# Patient Record
Sex: Female | Born: 1975
Health system: Southern US, Community
[De-identification: ages and names within clinical notes are randomized; demographics above are authoritative.]

## PROBLEM LIST (undated history)

## (undated) DIAGNOSIS — E079 Disorder of thyroid, unspecified: Secondary | ICD-10-CM

---

## 1999-10-12 ENCOUNTER — Other Ambulatory Visit: Admission: RE | Admit: 1999-10-12 | Discharge: 1999-10-12 | Payer: Self-pay | Admitting: *Deleted

## 2001-04-29 ENCOUNTER — Encounter: Payer: Self-pay | Admitting: Emergency Medicine

## 2001-04-29 ENCOUNTER — Emergency Department (HOSPITAL_COMMUNITY): Admission: EM | Admit: 2001-04-29 | Discharge: 2001-04-29 | Payer: Self-pay | Admitting: Emergency Medicine

## 2003-08-31 ENCOUNTER — Other Ambulatory Visit: Admission: RE | Admit: 2003-08-31 | Discharge: 2003-08-31 | Payer: Self-pay | Admitting: Obstetrics and Gynecology

## 2007-09-08 ENCOUNTER — Inpatient Hospital Stay (HOSPITAL_COMMUNITY): Admission: AD | Admit: 2007-09-08 | Discharge: 2007-09-08 | Payer: Self-pay | Admitting: Obstetrics and Gynecology

## 2008-01-24 ENCOUNTER — Ambulatory Visit (HOSPITAL_COMMUNITY): Admission: RE | Admit: 2008-01-24 | Discharge: 2008-01-24 | Payer: Self-pay | Admitting: *Deleted

## 2008-01-30 ENCOUNTER — Ambulatory Visit (HOSPITAL_COMMUNITY): Admission: RE | Admit: 2008-01-30 | Discharge: 2008-01-30 | Payer: Self-pay | Admitting: *Deleted

## 2008-02-10 ENCOUNTER — Encounter: Admission: RE | Admit: 2008-02-10 | Discharge: 2008-02-10 | Payer: Self-pay | Admitting: *Deleted

## 2009-05-17 ENCOUNTER — Inpatient Hospital Stay (HOSPITAL_COMMUNITY): Admission: AD | Admit: 2009-05-17 | Discharge: 2009-05-20 | Payer: Self-pay | Admitting: Obstetrics and Gynecology

## 2010-11-25 LAB — COMPREHENSIVE METABOLIC PANEL
ALT: 18 U/L (ref 0–35)
AST: 18 U/L (ref 0–37)
Albumin: 2.4 g/dL — ABNORMAL LOW (ref 3.5–5.2)
Alkaline Phosphatase: 101 U/L (ref 39–117)
BUN: 6 mg/dL (ref 6–23)
CO2: 23 mEq/L (ref 19–32)
Calcium: 9.2 mg/dL (ref 8.4–10.5)
Chloride: 106 mEq/L (ref 96–112)
Creatinine, Ser: 0.56 mg/dL (ref 0.4–1.2)
GFR calc Af Amer: >60 mL/min (ref >60)
GFR calc non Af Amer: >60 mL/min (ref >60)
Glucose, Bld: 106 mg/dL — ABNORMAL HIGH (ref 70–99)
Potassium: 4 mEq/L (ref 3.5–5.1)
Sodium: 136 mEq/L (ref 135–145)
Total Bilirubin: 0.3 mg/dL (ref 0.3–1.2)
Total Protein: 6 g/dL (ref 6.0–8.3)

## 2010-11-25 LAB — CBC
HCT: 29.1 % — ABNORMAL LOW (ref 36.0–46.0)
HCT: 36.3 % (ref 36.0–46.0)
Hemoglobin: 12.2 g/dL (ref 12.0–15.0)
Hemoglobin: 9.9 g/dL — ABNORMAL LOW (ref 12.0–15.0)
MCHC: 33.6 g/dL (ref 30.0–36.0)
MCHC: 34 g/dL (ref 30.0–36.0)
MCV: 89.5 fL (ref 78.0–100.0)
Platelets: 263 10*3/uL (ref 150–400)
RBC: 4.05 MIL/uL (ref 3.87–5.11)
RDW: 13.4 % (ref 11.5–15.5)
RDW: 13.6 % (ref 11.5–15.5)
WBC: 13 10*3/uL — ABNORMAL HIGH (ref 4.0–10.5)

## 2010-11-25 LAB — LACTATE DEHYDROGENASE: LDH: 121 U/L (ref 94–250)

## 2010-11-25 LAB — TYPE AND SCREEN: Antibody Screen: NEGATIVE

## 2010-11-25 LAB — URIC ACID: Uric Acid, Serum: 4.6 mg/dL (ref 2.4–7.0)

## 2010-11-25 LAB — RPR: RPR Ser Ql: NONREACTIVE

## 2011-05-12 LAB — POCT PREGNANCY, URINE: Preg Test, Ur: POSITIVE

## 2016-01-13 ENCOUNTER — Telehealth: Payer: Self-pay | Admitting: Cardiovascular Disease

## 2016-01-13 NOTE — Telephone Encounter (Signed)
Received records from [facility] for appointment on 02/08/16 with Dr Allyson SabalBerry.  [facility] for Dr Hazle CocaBerry's schedule on 02/08/16. lp

## 2016-02-08 ENCOUNTER — Ambulatory Visit: Payer: 59 | Admitting: Cardiovascular Disease

## 2016-04-04 ENCOUNTER — Ambulatory Visit: Payer: 59 | Admitting: Cardiovascular Disease

## 2016-04-04 ENCOUNTER — Encounter: Payer: Self-pay | Admitting: *Deleted

## 2016-04-04 NOTE — Progress Notes (Signed)
Letter mailed

## 2016-06-12 ENCOUNTER — Other Ambulatory Visit (HOSPITAL_COMMUNITY): Payer: Self-pay | Admitting: Surgery

## 2016-06-19 ENCOUNTER — Encounter: Payer: Self-pay | Admitting: Skilled Nursing Facility1

## 2016-06-19 ENCOUNTER — Encounter: Payer: 59 | Attending: Surgery | Admitting: Skilled Nursing Facility1

## 2016-06-19 DIAGNOSIS — E6609 Other obesity due to excess calories: Secondary | ICD-10-CM

## 2016-06-19 NOTE — Patient Instructions (Signed)
Follow Pre-Op Goals Try Protein Shakes Call [facility] at [phone] when surgery is scheduled to enroll in Pre-Op Class  Things to remember:  Please always be honest with us. We want to support you!  If you have any questions or concerns in between appointments, please call or email Liz, Leslie, or Laurie.  The diet after surgery will be high protein and low in carbohydrate.  Vitamins and calcium need to be taken for the rest of your life.  Feel free to include support people in any classes or appointments.   Supplement recommendations:  Before Surgery   1 Complete Multivitamin with Iron  3000 IU Vitamin D3  After Surgery   2 Chewable Multivitamins  **Best Choice - Bariatric Advantage Advanced Multi EA      3 Chewable Calcium (500 mg each, total 1200-1500 mg per day)  **Best Choice - Celebrate, Bariatric Advantage, or Wellesse  Other Options:    2 Flinstones Complete + up to 100 mg Thiamin + 2000-3000 IU Vitamin D3 + 350-500 mcg Vitamin B12 + 30-45 mg Iron (with history of deficiency)  2 Celebrate MultiComplete with 18 mg Iron (this provides 6000 IU of  Vitamin D3)  4 Celebrate Essential Multi 2 in 1 (has calcium) + 18-60 mg separate  iron  Vitamins and Calcium are available at:   [facility]   [address], [place], [place] [address]   [email or web address]  [email or web address]  [email or web address]   

## 2016-06-19 NOTE — Progress Notes (Signed)
   Pre-Op Assessment Visit:  Pre-Operative Roux En Y Bypass Surgery  Medical Nutrition Therapy:  Appt start time: 2:10  End time: 3:03  Patient was seen on 06/19/2016 for Pre-Operative Nutrition Assessment. Assessment and [facility] coordinator on 06/19/2016.  Pt states she wants her husband to do the surgery too. Pt states she is worried about having the right surgery to assuage her emotional eating tendencies.  Preferred Learning Style:   No preference indicated   Learning Readiness:   Ready  Handouts given during visit include:  Pre-Op Goals Bariatric Surgery Protein Shakes During the appointment today the following Pre-Op Goals were reviewed with the patient: Maintain or lose weight as instructed by your surgeon Make healthy food choices Begin to limit portion sizes Limited concentrated sugars and fried foods Keep fat/sugar in the single digits per serving on  food labels Practice CHEWING your food  (aim for 30 chews per bite or until applesauce consistency) Practice not drinking 15 minutes before, during, and 30 minutes after each meal/snack Avoid all carbonated beverages  Avoid/limit caffeinated beverages  Avoid all sugar-sweetened beverages Consume 3 meals per day; eat every 3-5 hours Make a list of non-food related activities Aim for 64-100 ounces of FLUID daily  Aim for at least 60-80 grams of PROTEIN daily Look for a liquid protein source that contain ?15 g protein and ?5 g carbohydrate  (ex: shakes, drinks, shots)  Patient-Centered Goals: 10/10 specific/non-scale and confidence/importance scale 1-10  Demonstrated degree of understanding via:  Teach Back  Teaching Method Utilized:  Visual Auditory Hands on  Barriers to learning/adherence to lifestyle change: none identified  [facility] to [facility] the [facility] to enroll in Pre-Op and Post-Op Nutrition Education when  surgery date is scheduled.

## 2016-06-22 ENCOUNTER — Ambulatory Visit (HOSPITAL_COMMUNITY): Payer: 59

## 2016-07-07 ENCOUNTER — Ambulatory Visit (HOSPITAL_COMMUNITY)
Admission: RE | Admit: 2016-07-07 | Discharge: 2016-07-07 | Disposition: A | Discharge status: Home / Self Care | Payer: 59 | Source: Ambulatory Visit | Attending: Surgery | Admitting: Surgery

## 2016-07-07 ENCOUNTER — Encounter (HOSPITAL_COMMUNITY): Payer: Self-pay | Admitting: Radiology

## 2016-07-07 DIAGNOSIS — Z6841 Body Mass Index (BMI) 40.0 and over, adult: Secondary | ICD-10-CM | POA: Diagnosis not present

## 2016-07-07 DIAGNOSIS — K219 Gastro-esophageal reflux disease without esophagitis: Secondary | ICD-10-CM | POA: Diagnosis not present

## 2016-07-31 ENCOUNTER — Encounter: Payer: 59 | Attending: Surgery | Admitting: Dietician

## 2016-08-01 ENCOUNTER — Encounter: Payer: Self-pay | Admitting: Dietician

## 2016-08-01 NOTE — Progress Notes (Signed)
  Pre-Operative Nutrition Class:  Appt start time: 3832   End time:  1830.  Patient was seen on 07/31/16 for Pre-Operative Bariatric Surgery Education at the [facility].   Surgery date:  Surgery type: RYGB Start weight at [facility]: 306 lbs on 06/19/2016 Weight today: 307.3 lbs  TANITA  BODY COMP RESULTS  07/31/16   BMI (kg/m^2) n/a   Fat Mass (lbs)    Fat Free Mass (lbs)    Total Body Water (lbs)    Samples given per MNT protocol. Patient educated on appropriate usage: Premier protein shake (strawberry - qty 1) Lot #: [id number] Exp: 05/2017  Unjury Protein Powder (chicken soup - qty 1) Lot #: [id number] Exp: 10/2017  The following the learning objectives were met by the patient during this course:  Identify Pre-Op Dietary Goals and will begin 2 weeks pre-operatively  Identify appropriate sources of fluids and proteins   State protein recommendations and appropriate sources pre and post-operatively  Identify Post-Operative Dietary Goals and will follow for 2 weeks post-operatively  Identify appropriate multivitamin and calcium sources  Describe the need for physical activity post-operatively and will follow MD recommendations  State when to call healthcare provider regarding medication questions or post-operative complications  Handouts given during class include:  Pre-Op Bariatric Surgery Diet Handout  Protein Shake Handout  Post-Op Bariatric Surgery Nutrition Handout  BELT Program Information Flyer  Support Group Information Flyer  [facility] Bariatric Supplements Price List  Follow-Up Plan: Patient will follow-up at [facility] 2 weeks post operatively for diet advancement per MD.

## 2016-09-15 DIAGNOSIS — N951 Menopausal and female climacteric states: Secondary | ICD-10-CM | POA: Diagnosis not present

## 2016-09-19 DIAGNOSIS — N951 Menopausal and female climacteric states: Secondary | ICD-10-CM | POA: Diagnosis not present

## 2016-09-19 DIAGNOSIS — I10 Essential (primary) hypertension: Secondary | ICD-10-CM | POA: Diagnosis not present

## 2016-09-19 DIAGNOSIS — E039 Hypothyroidism, unspecified: Secondary | ICD-10-CM | POA: Diagnosis not present

## 2016-09-19 DIAGNOSIS — E539 Vitamin B deficiency, unspecified: Secondary | ICD-10-CM | POA: Diagnosis not present

## 2016-09-19 DIAGNOSIS — R7301 Impaired fasting glucose: Secondary | ICD-10-CM | POA: Diagnosis not present

## 2016-09-28 DIAGNOSIS — R7301 Impaired fasting glucose: Secondary | ICD-10-CM | POA: Diagnosis not present

## 2016-09-28 DIAGNOSIS — E539 Vitamin B deficiency, unspecified: Secondary | ICD-10-CM | POA: Diagnosis not present

## 2016-09-28 DIAGNOSIS — I10 Essential (primary) hypertension: Secondary | ICD-10-CM | POA: Diagnosis not present

## 2016-10-06 DIAGNOSIS — E539 Vitamin B deficiency, unspecified: Secondary | ICD-10-CM | POA: Diagnosis not present

## 2016-10-13 DIAGNOSIS — E538 Deficiency of other specified B group vitamins: Secondary | ICD-10-CM | POA: Diagnosis not present

## 2016-10-19 DIAGNOSIS — Z124 Encounter for screening for malignant neoplasm of cervix: Secondary | ICD-10-CM | POA: Diagnosis not present

## 2016-10-19 DIAGNOSIS — Z01419 Encounter for gynecological examination (general) (routine) without abnormal findings: Secondary | ICD-10-CM | POA: Diagnosis not present

## 2017-05-01 DIAGNOSIS — R946 Abnormal results of thyroid function studies: Secondary | ICD-10-CM | POA: Diagnosis not present

## 2017-05-03 DIAGNOSIS — M2550 Pain in unspecified joint: Secondary | ICD-10-CM | POA: Diagnosis not present

## 2017-05-03 DIAGNOSIS — N926 Irregular menstruation, unspecified: Secondary | ICD-10-CM | POA: Diagnosis not present

## 2017-05-03 DIAGNOSIS — E039 Hypothyroidism, unspecified: Secondary | ICD-10-CM | POA: Diagnosis not present

## 2017-05-04 DIAGNOSIS — M170 Bilateral primary osteoarthritis of knee: Secondary | ICD-10-CM | POA: Diagnosis not present

## 2017-05-04 DIAGNOSIS — M2550 Pain in unspecified joint: Secondary | ICD-10-CM | POA: Diagnosis not present

## 2017-05-08 DIAGNOSIS — R700 Elevated erythrocyte sedimentation rate: Secondary | ICD-10-CM | POA: Diagnosis not present

## 2017-05-08 DIAGNOSIS — M79672 Pain in left foot: Secondary | ICD-10-CM | POA: Diagnosis not present

## 2017-05-08 DIAGNOSIS — M7989 Other specified soft tissue disorders: Secondary | ICD-10-CM | POA: Diagnosis not present

## 2017-05-08 DIAGNOSIS — M79641 Pain in right hand: Secondary | ICD-10-CM | POA: Diagnosis not present

## 2017-05-08 DIAGNOSIS — M170 Bilateral primary osteoarthritis of knee: Secondary | ICD-10-CM | POA: Diagnosis not present

## 2017-05-08 DIAGNOSIS — M2550 Pain in unspecified joint: Secondary | ICD-10-CM | POA: Diagnosis not present

## 2017-05-08 DIAGNOSIS — M25561 Pain in right knee: Secondary | ICD-10-CM | POA: Diagnosis not present

## 2017-05-08 DIAGNOSIS — M79643 Pain in unspecified hand: Secondary | ICD-10-CM | POA: Diagnosis not present

## 2017-05-08 DIAGNOSIS — M19041 Primary osteoarthritis, right hand: Secondary | ICD-10-CM | POA: Diagnosis not present

## 2017-05-30 DIAGNOSIS — M79643 Pain in unspecified hand: Secondary | ICD-10-CM | POA: Diagnosis not present

## 2017-05-30 DIAGNOSIS — M2550 Pain in unspecified joint: Secondary | ICD-10-CM | POA: Diagnosis not present

## 2017-05-30 DIAGNOSIS — N390 Urinary tract infection, site not specified: Secondary | ICD-10-CM | POA: Diagnosis not present

## 2017-05-30 DIAGNOSIS — R700 Elevated erythrocyte sedimentation rate: Secondary | ICD-10-CM | POA: Diagnosis not present

## 2017-07-16 DIAGNOSIS — R0989 Other specified symptoms and signs involving the circulatory and respiratory systems: Secondary | ICD-10-CM | POA: Diagnosis not present

## 2017-07-16 DIAGNOSIS — R0981 Nasal congestion: Secondary | ICD-10-CM | POA: Diagnosis not present

## 2017-08-08 DIAGNOSIS — Z0100 Encounter for examination of eyes and vision without abnormal findings: Secondary | ICD-10-CM | POA: Diagnosis not present

## 2017-08-09 DIAGNOSIS — M79644 Pain in right finger(s): Secondary | ICD-10-CM | POA: Diagnosis not present

## 2017-08-09 DIAGNOSIS — R200 Anesthesia of skin: Secondary | ICD-10-CM | POA: Diagnosis not present

## 2017-09-13 DIAGNOSIS — M79645 Pain in left finger(s): Secondary | ICD-10-CM | POA: Diagnosis not present

## 2017-09-13 DIAGNOSIS — R200 Anesthesia of skin: Secondary | ICD-10-CM | POA: Diagnosis not present

## 2017-09-13 DIAGNOSIS — M79644 Pain in right finger(s): Secondary | ICD-10-CM | POA: Diagnosis not present

## 2017-10-15 DIAGNOSIS — R700 Elevated erythrocyte sedimentation rate: Secondary | ICD-10-CM | POA: Diagnosis not present

## 2017-10-15 DIAGNOSIS — M549 Dorsalgia, unspecified: Secondary | ICD-10-CM | POA: Diagnosis not present

## 2017-10-15 DIAGNOSIS — M79643 Pain in unspecified hand: Secondary | ICD-10-CM | POA: Diagnosis not present

## 2017-10-15 DIAGNOSIS — M545 Low back pain: Secondary | ICD-10-CM | POA: Diagnosis not present

## 2017-10-15 DIAGNOSIS — M2550 Pain in unspecified joint: Secondary | ICD-10-CM | POA: Diagnosis not present

## 2017-11-01 DIAGNOSIS — E669 Obesity, unspecified: Secondary | ICD-10-CM | POA: Diagnosis not present

## 2017-11-01 DIAGNOSIS — E039 Hypothyroidism, unspecified: Secondary | ICD-10-CM | POA: Diagnosis not present

## 2017-11-01 DIAGNOSIS — M2550 Pain in unspecified joint: Secondary | ICD-10-CM | POA: Diagnosis not present

## 2017-11-15 DIAGNOSIS — Z124 Encounter for screening for malignant neoplasm of cervix: Secondary | ICD-10-CM | POA: Diagnosis not present

## 2017-11-15 DIAGNOSIS — Z01419 Encounter for gynecological examination (general) (routine) without abnormal findings: Secondary | ICD-10-CM | POA: Diagnosis not present

## 2017-12-24 DIAGNOSIS — J069 Acute upper respiratory infection, unspecified: Secondary | ICD-10-CM | POA: Diagnosis not present

## 2017-12-24 DIAGNOSIS — B9789 Other viral agents as the cause of diseases classified elsewhere: Secondary | ICD-10-CM | POA: Diagnosis not present

## 2018-01-16 DIAGNOSIS — M67442 Ganglion, left hand: Secondary | ICD-10-CM | POA: Diagnosis not present

## 2018-03-04 DIAGNOSIS — M79644 Pain in right finger(s): Secondary | ICD-10-CM | POA: Diagnosis not present

## 2018-04-16 DIAGNOSIS — R635 Abnormal weight gain: Secondary | ICD-10-CM | POA: Diagnosis not present

## 2018-04-16 DIAGNOSIS — N951 Menopausal and female climacteric states: Secondary | ICD-10-CM | POA: Diagnosis not present

## 2018-04-23 DIAGNOSIS — R7301 Impaired fasting glucose: Secondary | ICD-10-CM | POA: Diagnosis not present

## 2018-04-23 DIAGNOSIS — N951 Menopausal and female climacteric states: Secondary | ICD-10-CM | POA: Diagnosis not present

## 2018-05-06 DIAGNOSIS — I10 Essential (primary) hypertension: Secondary | ICD-10-CM | POA: Diagnosis not present

## 2018-05-27 DIAGNOSIS — I10 Essential (primary) hypertension: Secondary | ICD-10-CM | POA: Diagnosis not present

## 2018-05-27 DIAGNOSIS — E7800 Pure hypercholesterolemia, unspecified: Secondary | ICD-10-CM | POA: Diagnosis not present

## 2018-05-28 DIAGNOSIS — M7731 Calcaneal spur, right foot: Secondary | ICD-10-CM | POA: Diagnosis not present

## 2018-05-28 DIAGNOSIS — M722 Plantar fascial fibromatosis: Secondary | ICD-10-CM | POA: Diagnosis not present

## 2018-05-28 DIAGNOSIS — M76821 Posterior tibial tendinitis, right leg: Secondary | ICD-10-CM | POA: Diagnosis not present

## 2018-06-05 DIAGNOSIS — M71572 Other bursitis, not elsewhere classified, left ankle and foot: Secondary | ICD-10-CM | POA: Diagnosis not present

## 2018-06-05 DIAGNOSIS — M722 Plantar fascial fibromatosis: Secondary | ICD-10-CM | POA: Diagnosis not present

## 2018-06-06 DIAGNOSIS — I10 Essential (primary) hypertension: Secondary | ICD-10-CM | POA: Diagnosis not present

## 2018-06-13 DIAGNOSIS — E7800 Pure hypercholesterolemia, unspecified: Secondary | ICD-10-CM | POA: Diagnosis not present

## 2018-06-20 DIAGNOSIS — R209 Unspecified disturbances of skin sensation: Secondary | ICD-10-CM | POA: Diagnosis not present

## 2018-06-20 DIAGNOSIS — E669 Obesity, unspecified: Secondary | ICD-10-CM | POA: Diagnosis not present

## 2018-06-20 DIAGNOSIS — E039 Hypothyroidism, unspecified: Secondary | ICD-10-CM | POA: Diagnosis not present

## 2018-07-04 DIAGNOSIS — I10 Essential (primary) hypertension: Secondary | ICD-10-CM | POA: Diagnosis not present

## 2018-09-22 DIAGNOSIS — H0011 Chalazion right upper eyelid: Secondary | ICD-10-CM | POA: Diagnosis not present

## 2018-09-22 DIAGNOSIS — I888 Other nonspecific lymphadenitis: Secondary | ICD-10-CM | POA: Diagnosis not present

## 2018-10-29 DIAGNOSIS — S90122A Contusion of left lesser toe(s) without damage to nail, initial encounter: Secondary | ICD-10-CM | POA: Diagnosis not present

## 2018-10-29 DIAGNOSIS — R05 Cough: Secondary | ICD-10-CM | POA: Diagnosis not present

## 2019-08-31 ENCOUNTER — Encounter (HOSPITAL_COMMUNITY): Payer: Self-pay | Admitting: Emergency Medicine

## 2019-08-31 ENCOUNTER — Emergency Department (HOSPITAL_COMMUNITY)
Admission: EM | Admit: 2019-08-31 | Discharge: 2019-08-31 | Disposition: A | Discharge status: Home / Self Care | Payer: 59 | Attending: Emergency Medicine | Admitting: Emergency Medicine

## 2019-08-31 ENCOUNTER — Emergency Department (HOSPITAL_COMMUNITY): Payer: 59

## 2019-08-31 ENCOUNTER — Other Ambulatory Visit: Payer: Self-pay

## 2019-08-31 DIAGNOSIS — Z20822 Contact with and (suspected) exposure to covid-19: Secondary | ICD-10-CM | POA: Insufficient documentation

## 2019-08-31 DIAGNOSIS — R079 Chest pain, unspecified: Secondary | ICD-10-CM

## 2019-08-31 DIAGNOSIS — R509 Fever, unspecified: Secondary | ICD-10-CM | POA: Diagnosis not present

## 2019-08-31 DIAGNOSIS — Z79899 Other long term (current) drug therapy: Secondary | ICD-10-CM | POA: Insufficient documentation

## 2019-08-31 DIAGNOSIS — M79601 Pain in right arm: Secondary | ICD-10-CM | POA: Insufficient documentation

## 2019-08-31 DIAGNOSIS — R0602 Shortness of breath: Secondary | ICD-10-CM | POA: Insufficient documentation

## 2019-08-31 DIAGNOSIS — R000 Tachycardia, unspecified: Secondary | ICD-10-CM | POA: Insufficient documentation

## 2019-08-31 DIAGNOSIS — R5383 Other fatigue: Secondary | ICD-10-CM | POA: Diagnosis not present

## 2019-08-31 DIAGNOSIS — R918 Other nonspecific abnormal finding of lung field: Secondary | ICD-10-CM | POA: Diagnosis not present

## 2019-08-31 DIAGNOSIS — R0789 Other chest pain: Secondary | ICD-10-CM | POA: Insufficient documentation

## 2019-08-31 DIAGNOSIS — R61 Generalized hyperhidrosis: Secondary | ICD-10-CM | POA: Insufficient documentation

## 2019-08-31 DIAGNOSIS — E039 Hypothyroidism, unspecified: Secondary | ICD-10-CM | POA: Insufficient documentation

## 2019-08-31 HISTORY — DX: Disorder of thyroid, unspecified: E07.9

## 2019-08-31 LAB — BASIC METABOLIC PANEL
Anion gap: 11 (ref 5–15)
BUN: 17 mg/dL (ref 6–20)
CO2: 25 mmol/L (ref 22–32)
Calcium: 9.8 mg/dL (ref 8.9–10.3)
Chloride: 100 mmol/L (ref 98–111)
Creatinine, Ser: 1.01 mg/dL — ABNORMAL HIGH (ref 0.44–1.00)
GFR calc Af Amer: >60 mL/min (ref >60)
GFR calc non Af Amer: >60 mL/min (ref >60)
Glucose, Bld: 113 mg/dL — ABNORMAL HIGH (ref 70–99)
Potassium: 3.9 mmol/L (ref 3.5–5.1)
Sodium: 136 mmol/L (ref 135–145)

## 2019-08-31 LAB — CBC
HCT: 48.6 % — ABNORMAL HIGH (ref 36.0–46.0)
Hemoglobin: 15.6 g/dL — ABNORMAL HIGH (ref 12.0–15.0)
MCH: 26.5 pg (ref 26.0–34.0)
MCHC: 32.1 g/dL (ref 30.0–36.0)
MCV: 82.7 fL (ref 80.0–100.0)
Platelets: 364 10*3/uL (ref 150–400)
RBC: 5.88 MIL/uL — ABNORMAL HIGH (ref 3.87–5.11)
RDW: 14.5 % (ref 11.5–15.5)
WBC: 6.4 10*3/uL (ref 4.0–10.5)
nRBC: 0 % (ref 0.0–0.2)

## 2019-08-31 LAB — TROPONIN I (HIGH SENSITIVITY): Troponin I (High Sensitivity): <2 ng/L (ref <18)

## 2019-08-31 LAB — D-DIMER, QUANTITATIVE: D-Dimer, Quant: 0.78 ug/mL-FEU — ABNORMAL HIGH (ref 0.00–0.50)

## 2019-08-31 LAB — I-STAT BETA HCG BLOOD, ED (MC, WL, AP ONLY): I-stat hCG, quantitative: <5 m[IU]/mL (ref <5)

## 2019-08-31 LAB — TSH: TSH: 1.401 u[IU]/mL (ref 0.350–4.500)

## 2019-08-31 LAB — POC SARS CORONAVIRUS 2 AG -  ED: SARS Coronavirus 2 Ag: NEGATIVE

## 2019-08-31 LAB — SARS CORONAVIRUS 2 (TAT 6-24 HRS): SARS Coronavirus 2: NEGATIVE

## 2019-08-31 MED ORDER — SODIUM CHLORIDE 0.9 % IV BOLUS
1000.0000 mL | Freq: Once | INTRAVENOUS | Status: AC
  Administered 2019-08-31: 1000 mL via INTRAVENOUS

## 2019-08-31 MED ORDER — HYDROXYZINE HCL 25 MG PO TABS: 25.0000 mg | ORAL_TABLET | Freq: Four times a day (QID) | ORAL | 0 refills | Status: DC

## 2019-08-31 MED ORDER — SODIUM CHLORIDE 0.9% FLUSH: 3.0000 mL | Freq: Once | INTRAVENOUS | Status: DC

## 2019-08-31 MED ORDER — IOHEXOL 350 MG/ML SOLN
75.0000 mL | Freq: Once | INTRAVENOUS | Status: AC | PRN
  Administered 2019-08-31: 75 mL via INTRAVENOUS

## 2019-08-31 MED ORDER — LORAZEPAM 2 MG/ML IJ SOLN
0.5000 mg | Freq: Once | INTRAMUSCULAR | Status: AC
  Administered 2019-08-31: 0.5 mg via INTRAVENOUS
  Filled 2019-08-31: qty 1

## 2019-08-31 NOTE — Discharge Instructions (Signed)
As we discussed, your work-up today was reassuring.  As we discussed, your CT scan did show a small area on the right lung consistent with a rounded black groundglass opacity.  This is a nonspecific finding and this needs to be reevaluated about 3 months by your primary care doctor for repeat imaging.  Additionally, you have a Covid test pending.  He should have the results in about 24 hours.  You can check online to see results.  You should quarantine until results come back.  If you are positive, you will need to quarantine for 14 days.  Please follow up with your primary care doctor in 4-5 days for re-evaluation and inform him of the findings on CT.   Return emergency department for any difficulty breathing, chest pain, fevers or any other worsening or concerning symptoms.      Person Under Monitoring Name: Teresa Zuniga  Location: [place] [address]   Infection Prevention Recommendations for Individuals Confirmed to have, or Being Evaluated for, 2019 Novel Coronavirus (COVID-19) Infection Who Receive Care at Home  Individuals who are confirmed to have, or are being evaluated for, COVID-19 should follow the prevention steps below until a healthcare provider or local or state health department says they can return to normal activities.  Stay home except to get medical care You should restrict activities outside your home, except for getting medical care. Do not go to work, school, or public areas, and do not use public transportation or taxis.  Call ahead before visiting your doctor Before your medical appointment, call the healthcare provider and tell them that you have, or are being evaluated for, COVID-19 infection. This will help the healthcare providers office take steps to keep other people from getting infected. Ask your healthcare provider to call the local or state health department.  Monitor your symptoms Seek prompt medical attention if your illness  is worsening (e.g., difficulty breathing). Before going to your medical appointment, call the healthcare provider and tell them that you have, or are being evaluated for, COVID-19 infection. Ask your healthcare provider to call the local or state health department.  Wear a facemask You should wear a facemask that covers your nose and mouth when you are in the same room with other people and when you visit a healthcare provider. People who live with or visit you should also wear a facemask while they are in the same room with you.  Separate yourself from other people in your home As much as possible, you should stay in a different room from other people in your home. Also, you should use a separate bathroom, if available.  Avoid sharing household items You should not share dishes, drinking glasses, cups, eating utensils, towels, bedding, or other items with other people in your home. After using these items, you should wash them thoroughly with soap and water.  Cover your coughs and sneezes Cover your mouth and nose with a tissue when you cough or sneeze, or you can cough or sneeze into your sleeve. Throw used tissues in a lined trash can, and immediately wash your hands with soap and water for at least 20 seconds or use an alcohol-based hand rub.  Wash your [facility] your hands often and thoroughly with soap and water for at least 20 seconds. You can use an alcohol-based hand sanitizer if soap and water are not available and if your hands are not visibly dirty. Avoid touching your eyes, nose, and mouth with unwashed  hands.   Prevention Steps for Caregivers and Household Members of Individuals Confirmed to have, or Being Evaluated for, COVID-19 Infection Being Cared for in the Home  If you live with, or provide care at home for, a person confirmed to have, or being evaluated for, COVID-19 infection please follow these guidelines to prevent infection:  Follow healthcare providers  instructions Make sure that you understand and can help the patient follow any healthcare provider instructions for all care.  Provide for the patients basic needs You should help the patient with basic needs in the home and provide support for getting groceries, prescriptions, and other personal needs.  Monitor the patients symptoms If they are getting sicker, call his or her medical provider and tell them that the patient has, or is being evaluated for, COVID-19 infection. This will help the healthcare providers office take steps to keep other people from getting infected. Ask the healthcare provider to call the local or state health department.  Limit the number of people who have contact with the patient If possible, have only one caregiver for the patient. Other household members should stay in another home or place of residence. If this is not possible, they should stay in another room, or be separated from the patient as much as possible. Use a separate bathroom, if available. Restrict visitors who do not have an essential need to be in the home.  Keep older adults, very young children, and other sick people away from the patient Keep older adults, very young children, and those who have compromised immune systems or chronic health conditions away from the patient. This includes people with chronic heart, lung, or kidney conditions, diabetes, and cancer.  Ensure good ventilation Make sure that shared spaces in the home have good air flow, such as from an air conditioner or an opened window, weather permitting.  Wash your hands often Wash your hands often and thoroughly with soap and water for at least 20 seconds. You can use an alcohol based hand sanitizer if soap and water are not available and if your hands are not visibly dirty. Avoid touching your eyes, nose, and mouth with unwashed hands. Use disposable paper towels to dry your hands. If not available, use dedicated cloth  towels and replace them when they become wet.  Wear a facemask and gloves Wear a disposable facemask at all times in the room and gloves when you touch or have contact with the patients blood, body fluids, and/or secretions or excretions, such as sweat, saliva, sputum, nasal mucus, vomit, urine, or feces.  Ensure the mask fits over your nose and mouth tightly, and do not touch it during use. Throw out disposable facemasks and gloves after using them. Do not reuse. Wash your hands immediately after removing your facemask and gloves. If your personal clothing becomes contaminated, carefully remove clothing and launder. Wash your hands after handling contaminated clothing. Place all used disposable facemasks, gloves, and other waste in a lined container before disposing them with other household waste. Remove gloves and wash your hands immediately after handling these items.  Do not share dishes, glasses, or other household items with the patient Avoid sharing household items. You should not share dishes, drinking glasses, cups, eating utensils, towels, bedding, or other items with a patient who is confirmed to have, or being evaluated for, COVID-19 infection. After the person uses these items, you should wash them thoroughly with soap and water.  Wash laundry thoroughly Immediately remove and wash clothes or bedding  that have blood, body fluids, and/or secretions or excretions, such as sweat, saliva, sputum, nasal mucus, vomit, urine, or feces, on them. Wear gloves when handling laundry from the patient. Read and follow directions on labels of laundry or clothing items and detergent. In general, wash and dry with the warmest temperatures recommended on the label.  Clean all areas the individual has used often Clean all touchable surfaces, such as counters, tabletops, doorknobs, bathroom fixtures, toilets, phones, keyboards, tablets, and bedside tables, every day. Also, clean any surfaces that may  have blood, body fluids, and/or secretions or excretions on them. Wear gloves when cleaning surfaces the patient has come in contact with. Use a diluted bleach solution (e.g., dilute bleach with 1 part bleach and 10 parts water) or a household disinfectant with a label that says EPA-registered for coronaviruses. To make a bleach solution at home, add 1 tablespoon of bleach to 1 quart (4 cups) of water. For a larger supply, add  cup of bleach to 1 gallon (16 cups) of water. Read labels of cleaning products and follow recommendations provided on product labels. Labels contain instructions for safe and effective use of the cleaning product including precautions you should take when applying the product, such as wearing gloves or eye protection and making sure you have good ventilation during use of the product. Remove gloves and wash hands immediately after cleaning.  Monitor yourself for signs and symptoms of illness Caregivers and household members are considered close contacts, should monitor their health, and will be asked to limit movement outside of the home to the extent possible. Follow the monitoring steps for close contacts listed on the symptom monitoring form.   ? If you have additional questions, contact your local health department or call the epidemiologist on call at [phone] (available 24/7). ? This guidance is subject to change. For the most up-to-date guidance from [facility], please refer to their website: [email or web address]

## 2019-08-31 NOTE — ED Provider Notes (Signed)
[facility] EMERGENCY DEPARTMENT Provider Note   CSN: [id number] Arrival date & time: 08/31/19  1219     History Chief Complaint  Patient presents with  . Chest Pain    Teresa Zuniga is a 44 y.o. female.  HPI  Is a 44 year old female with history of obesity, hypothyroidism and undiagnosed chronic anxiety per patient  Patient presents today for 3 days of constant left-sided chest pressure that is constant, achy, nonradiating, with associated diaphoresis, and shortness of breath that is worse with exertion.  Patient states the chest pain is nonexertional.  States she has a separate area of right-sided arm pain that is not radiating from the chest.  States that she was seen in urgent care yesterday and told that she had a slight fever of 9.8 and was tachycardic.  She was tested for Covid with results that are pending.  States that she has never been a smoker denies any leg pain or leg swelling denies any recent surgery or immobilization.  States she has never had any clots.  Denies any pleuritic component of chest pain.  Does endorse some feeling of her heart racing but denies any irregular palpitations.  Patient denies any subjective fevers, cough, congestion, loss of taste or smell.  No known sick contacts.  Denies any hemoptysis.  Patient states that she is been taking her 125 mg of Synthroid for the past year with no changes in medications.  Denies any heat or cold intolerance.  States that she has a history of weight fluctuations has been ongoing for years and is due to her attempts to lose weight.     Past Medical History:  Diagnosis Date  . Thyroid disease     There are no problems to display for this patient.   History reviewed. No pertinent surgical history.   OB History   No obstetric history on file.     No family history on file.  Social History   Tobacco Use  . Smoking status: Never Smoker  . Smokeless tobacco: Never Used  Substance Use  Topics  . Alcohol use: Yes  . Drug use: Never    Home Medications Prior to Admission medications   Medication Sig Start Date End Date Taking? Authorizing Provider  acetaminophen (TYLENOL) 500 MG tablet Take 500 mg by mouth every 6 (six) hours as needed for mild pain or headache.   Yes [provider]  Cholecalciferol (D-3-5) 125 MCG (5000 UT) capsule Take 5,000 Units by mouth daily.   Yes [provider]  levothyroxine (SYNTHROID) 125 MCG tablet Take 125 mcg by mouth daily.   Yes [provider]  magnesium gluconate (MAGONATE) 500 MG tablet Take 500 mg by mouth 2 (two) times daily.   Yes [provider]  triamterene-hydrochlorothiazide (MAXZIDE) 75-50 MG tablet Take 1 tablet by mouth daily.   Yes [provider]    Allergies    Patient has no known allergies.  Review of Systems   Review of Systems  Constitutional: Positive for fatigue. Negative for chills and fever.  HENT: Negative for congestion.   Eyes: Negative for pain.  Respiratory: Positive for chest tightness and shortness of breath. Negative for cough.   Cardiovascular: Positive for chest pain. Negative for leg swelling.  Gastrointestinal: Negative for abdominal pain and vomiting.  Genitourinary: Negative for dysuria and vaginal pain.  Musculoskeletal: Negative for myalgias.  Skin: Negative for rash.  Neurological: Negative for dizziness and headaches.    Physical Exam Updated Vital Signs  BP 102/76   Pulse 90   Temp 98.7 F (37.1 C) (Oral)   Resp 20   LMP 08/12/2019   SpO2 96%   Physical Exam Vitals and nursing note reviewed.  Constitutional:      General: She is not in acute distress.    Comments: Well-appearing obese 44 year old female very pleasant able to answer questions appropriately and follow commands  HENT:     Head: Normocephalic and atraumatic.     Nose: Nose normal.  Eyes:     General: No scleral icterus. Cardiovascular:     Rate and Rhythm: Normal  rate and regular rhythm.     Pulses: Normal pulses.     Heart sounds: Normal heart sounds.     Comments: Rate and rhythm with a normal limits. Pulmonary:     Effort: Pulmonary effort is normal. No respiratory distress.     Breath sounds: No wheezing.     Comments: Patient with respiratory rate of 20 no increased work of breathing, no wheezing, lungs clear to auscultation bilaterally. Abdominal:     Palpations: Abdomen is soft.     Tenderness: There is no abdominal tenderness.  Musculoskeletal:     Cervical back: Normal range of motion.     Right lower leg: No edema.     Left lower leg: No edema.  Skin:    General: Skin is warm and dry.     Capillary Refill: Capillary refill takes less than 2 seconds.  Neurological:     Mental Status: She is alert. Mental status is at baseline.  Psychiatric:        Mood and Affect: Mood normal.        Behavior: Behavior normal.     ED Results / Procedures / Treatments   Labs (all labs ordered are listed, but only abnormal results are displayed) Labs Reviewed  BASIC METABOLIC PANEL - Abnormal; Notable for the following components:      Result Value   Glucose, Bld 113 (*)    Creatinine, Ser 1.01 (*)    All other components within normal limits  CBC - Abnormal; Notable for the following components:   RBC 5.88 (*)    Hemoglobin 15.6 (*)    HCT 48.6 (*)    All other components within normal limits  D-DIMER, QUANTITATIVE (NOT AT [facility]) - Abnormal; Notable for the following components:   D-Dimer, Quant 0.78 (*)    All other components within normal limits  SARS CORONAVIRUS 2 (TAT 6-24 HRS)  TSH  I-STAT BETA HCG BLOOD, ED ([facility], [facility], [facility] ONLY)  POC SARS CORONAVIRUS 2 AG -  ED  TROPONIN I (HIGH SENSITIVITY)    EKG EKG Interpretation  Date/Time:  Sunday August 31 2019 12:23:55 EST Ventricular Rate:  120 PR Interval:  150 QRS Duration: 80 QT Interval:  322 QTC Calculation: 455 R Axis:   48 Text Interpretation: Sinus tachycardia Low voltage  QRS Borderline ECG Confirmed by Madalyn Rob [phone]) on 08/31/2019 1:29:16 PM   Radiology DG Chest Portable 1 View  Result Date: 08/31/2019 CLINICAL DATA:  Shortness of breath. EXAM: PORTABLE CHEST 1 VIEW COMPARISON:  July 07, 2016 FINDINGS: The heart size and mediastinal contours are within normal limits. Both lungs are clear. The visualized skeletal structures are unremarkable. IMPRESSION: No active disease. Electronically Signed   By: Dorise Bullion III M.D   On: 08/31/2019 14:11    Procedures Procedures (including critical care time)  Medications Ordered in ED Medications  sodium chloride flush (NS)  0.9 % injection 3 mL (3 mLs Intravenous Not Given 08/31/19 1354)  LORazepam (ATIVAN) injection 0.5 mg (has no administration in time range)  sodium chloride 0.9 % bolus 1,000 mL (1,000 mLs Intravenous New Bag/Given 08/31/19 1354)    ED Course  I have reviewed the triage vital signs and the nursing notes.  Pertinent labs & imaging results that were available during my care of the patient were reviewed by me and considered in my medical decision making (see chart for details).    MDM Rules/Calculators/A&P                      Patient is a 44 year old female presented today with chest pain for 3 days associated with some shortness of breath and heart palpitations/tachycardia.  On presentation she is tachycardic at 130 however this resolved to approximately a heart rate of 90 with no intervention.  Her respiratory rate is on the high end of normal.  She is satting within normal limits on room air.  She does appear to be anxious but in no acute distress on exam.  Lung exam is unremarkable there is no JVD or murmur on my exam however history is concerning for possibility of pulmonary embolism.  Also considered pericarditis, asthma, COPD, CHF, pneumothorax, thoracic aortic dissection, esophageal perforation and GERD.  I feel that pulmonary embolism is most likely and will order CT scan to  rule this out.  Patient does not have significant risk factors for PE however she is severely overweight and has symptoms concerning for this.  If negative most likely patient is experiencing symptoms related to anxiety or musculoskeletal strain.    Rapid Covid antigen negative.  Will swab with PCR.  TSH within normal meds.  D-dimer mildly elevated.  BMP unremarkable.  Troponin within normal limits as symptoms have been ongoing for 3 days doubt ACS-they have been constant this entire time.  No leukocytosis or anemia.  Patient appears slightly dehydrated/hemoconcentrated.  Chest x-ray within normal limits.  I personally reviewed the x-ray and see no infiltrate, Hampton hump or other concerning findings.  EKG is within normal limits other than mild tachycardia initially which resolved second EKG normal sinus rhythm.  No evidence of right heart strain on EKG.  No QT or PR abnormalities.  Patient given single dose of Ativan and 1 L normal saline.   Patient care transferred to Berdie Ogren, PA-C at 3:45 PM.  If CT scan is negative I discussed with patient most likely diagnosis of anxiety related chest pain or musculoskeletal strain.  She is interested in Atarax prescription at discharge and states she would be able to follow-up with her primary care doctor.   Final Clinical Impression(s) / ED Diagnoses Final diagnoses:  Chest pain, unspecified type  SOB (shortness of breath)    Rx / DC Orders ED Discharge Orders    None       Gailen Shelter, [place] 08/31/19 1545    Milagros Loll, MD 09/01/19 [phone]

## 2019-08-31 NOTE — ED Triage Notes (Addendum)
Pt went to a weight loss center on Friday and was told she had an abnormal EKG.  Went to PCP and told EKG was normal.  Reports substernal chest pain, R shoulder, R neck pain since Friday with diaphoresis and SOB with exertion.  Seen at an urgent care yesterday for "slight fever" and tachycardia for COVID testing.  Results pending.

## 2019-08-31 NOTE — ED Provider Notes (Signed)
Care assumed from [name], PA-C at shift change with CTA pending.  In brief, this patient is a 44 year old female past medical history of obesity, hypothyroidism who presents for evaluation of 3 days of constant left-sided chest pain.  She describes it as a constant aching type pain.  She does have some associated diaphoresis, shortness of breath.  Pain is nonexertional.  She was seen by her PCP with an reassuring work-up with continued symptoms so she came to the emergency department today.  Please see note from previous provider for full history/physical exam.  Physical Exam  BP 102/75   Pulse 85   Temp 98.7 F (37.1 C) (Oral)   Resp 15   LMP 08/12/2019   SpO2 98%   Physical Exam  ED Course/Procedures     Procedures  Results for orders placed or performed during the hospital encounter of 08/31/19 (from the past 24 hour(s))  Basic metabolic panel     Status: Abnormal   Collection Time: 08/31/19 12:30 PM  Result Value Ref Range   Sodium 136 135 - 145 mmol/L   Potassium 3.9 3.5 - 5.1 mmol/L   Chloride 100 98 - 111 mmol/L   CO2 25 22 - 32 mmol/L   Glucose, Bld 113 (H) 70 - 99 mg/dL   BUN 17 6 - 20 mg/dL   Creatinine, Ser 1.01 (H) 0.44 - 1.00 mg/dL   Calcium 9.8 8.9 - 10.3 mg/dL   GFR calc non Af Amer >60 >60 mL/min   GFR calc Af Amer >60 >60 mL/min   Anion gap 11 5 - 15  CBC     Status: Abnormal   Collection Time: 08/31/19 12:30 PM  Result Value Ref Range   WBC 6.4 4.0 - 10.5 K/uL   RBC 5.88 (H) 3.87 - 5.11 MIL/uL   Hemoglobin 15.6 (H) 12.0 - 15.0 g/dL   HCT 48.6 (H) 36.0 - 46.0 %   MCV 82.7 80.0 - 100.0 fL   MCH 26.5 26.0 - 34.0 pg   MCHC 32.1 30.0 - 36.0 g/dL   RDW 14.5 11.5 - 15.5 %   Platelets 364 150 - 400 K/uL   nRBC 0.0 0.0 - 0.2 %  Troponin I (High Sensitivity)     Status: None   Collection Time: 08/31/19 12:30 PM  Result Value Ref Range   Troponin I (High Sensitivity) <2 <18 ng/L  D-dimer, quantitative (not at [facility])     Status: Abnormal   Collection  Time: 08/31/19 12:30 PM  Result Value Ref Range   D-Dimer, Quant 0.78 (H) 0.00 - 0.50 ug/mL-FEU  I-Stat beta hCG blood, ED     Status: None   Collection Time: 08/31/19 12:37 PM  Result Value Ref Range   I-stat hCG, quantitative <5.0 <5 mIU/mL   Comment 3          TSH     Status: None   Collection Time: 08/31/19  1:43 PM  Result Value Ref Range   TSH 1.401 0.350 - 4.500 uIU/mL  POC SARS Coronavirus 2 Ag-ED - Nasal Swab (BD Veritor Kit)     Status: None   Collection Time: 08/31/19  2:33 PM  Result Value Ref Range   SARS Coronavirus 2 Ag NEGATIVE NEGATIVE  SARS CORONAVIRUS 2 (TAT 6-24 HRS) Nasopharyngeal Nasopharyngeal Swab     Status: None   Collection Time: 08/31/19  3:32 PM   Specimen: Nasopharyngeal Swab  Result Value Ref Range   SARS Coronavirus 2 NEGATIVE NEGATIVE    MDM  PLAN: Patient's D-dimer is elevated.  Plan for CTA.  If negative, will plan to go home.  Sent home with Atarax and follow-up with primary care doctor.  MDM:  Initial rapid Covid negative.  I-STAT beta negative.  Troponin negative.  Given that this chest pain has been constant for the last 3 days, 1 troponin is sufficient for ACS rule out.  CBC shows no leukocytosis.  Hemoglobin stable at 15.6.  BMP is unremarkable.  D-dimer slightly elevated.  TSH is 1.401.  CTA shows no evidence of PE.  There is mention of a 3.2 cm rounded groundglass opacity seen in the right lower lobe.  Recommend follow-up CT scan in 3 months.  I discussed results with patient.  At this time, given her reassuring work-up, feel that she can follow-up with primary care doctor.  Previous provider had discussed starting patient on Atarax.  She is still amenable to this.  We will plan to send her home.  I discussed with her that she needs to follow-up with her primary care doctor regarding the groundglass opacity seen on her CT scan.  Patient also aware that she has an outpatient Covid test pending. At this time, patient exhibits no emergent  life-threatening condition that require further evaluation in ED or admission. Patient had ample opportunity for questions and discussion. All patient's questions were answered with full understanding. Strict return precautions discussed. Patient expresses understanding and agreement to plan.   Teresa Zuniga was evaluated in Emergency Department on 08/31/2019 for the symptoms described in the history of present illness. She was evaluated in the context of the global COVID-19 pandemic, which necessitated consideration that the patient might be at risk for infection with the SARS-CoV-2 virus that causes COVID-19. Institutional protocols and algorithms that pertain to the evaluation of patients at risk for COVID-19 are in a state of rapid change based on information released by regulatory bodies including the CDC and federal and state organizations. These policies and algorithms were followed during the patient's care in the ED.  1. Chest pain, unspecified type   2. SOB (shortness of breath)   3. Ground glass opacity present on imaging of lung     Portions of this note were generated with Lobbyist. Dictation errors may occur despite best attempts at proofreading.    Volanda Napoleon, PA-C 08/31/19 [id number]    Isla Pence, MD 09/01/19 0002

## 2019-09-12 ENCOUNTER — Encounter: Payer: Self-pay | Admitting: Dietician

## 2019-09-16 ENCOUNTER — Encounter: Payer: Self-pay | Admitting: Cardiovascular Disease

## 2019-09-16 ENCOUNTER — Ambulatory Visit (INDEPENDENT_AMBULATORY_CARE_PROVIDER_SITE_OTHER): Payer: 59 | Admitting: Cardiovascular Disease

## 2019-09-16 ENCOUNTER — Other Ambulatory Visit: Payer: Self-pay

## 2019-09-16 DIAGNOSIS — R072 Precordial pain: Secondary | ICD-10-CM

## 2019-09-16 DIAGNOSIS — R0600 Dyspnea, unspecified: Secondary | ICD-10-CM

## 2019-09-16 DIAGNOSIS — R0609 Other forms of dyspnea: Secondary | ICD-10-CM

## 2019-09-16 DIAGNOSIS — R9431 Abnormal electrocardiogram [ECG] [EKG]: Secondary | ICD-10-CM | POA: Insufficient documentation

## 2019-09-16 DIAGNOSIS — R0789 Other chest pain: Secondary | ICD-10-CM | POA: Diagnosis not present

## 2019-09-16 NOTE — Progress Notes (Signed)
09/16/2019 Teresa Zuniga   May 22, 1976  [id number]  Primary Physician Shirline Frees, MD Primary Cardiologist: Lorretta Harp MD Lupe Carney, [place]  HPI:  Teresa Zuniga is a 44 y.o. morbidly overweight married Caucasian female mother of 1 child who is a Freight forwarder at [employer].  She was referred by the emergency room for evaluation of atypical chest pain.  Her only risk factor is family history with a father who had myocardial infarction and stent at age 108 and ultimately died of a myocardial infarction at age 32.  She is never had a heart attack or stroke.  She is hypothyroid.  She complains of dyspnea on exertion of last several months.  She was told she had long QTc but developed a recent EKG did not substantiate this.  She was seen in the emergency room 08/31/2019 with atypical chest pain that had been on for 3 days.Teresa Zuniga  Her work-up was unremarkable.  Chest CT ruled out pulmonary embolism.  There are no coronary calcifications.   Current Meds  Medication Sig  . acetaminophen (TYLENOL) 500 MG tablet Take 500 mg by mouth every 6 (six) hours as needed for mild pain or headache.  . Ashwagandha 500 MG CAPS Take 500 mg by mouth.  . Cholecalciferol (D-3-5) 125 MCG (5000 UT) capsule Take 5,000 Units by mouth daily.  Teresa Zuniga levothyroxine (SYNTHROID) 125 MCG tablet Take 125 mcg by mouth daily.  . magnesium gluconate (MAGONATE) 500 MG tablet Take 500 mg by mouth 2 (two) times daily.  Teresa Zuniga triamterene-hydrochlorothiazide (MAXZIDE) 75-50 MG tablet Take 1 tablet by mouth daily.  . [DISCONTINUED] hydrOXYzine (ATARAX/VISTARIL) 25 MG tablet Take 1 tablet (25 mg total) by mouth every 6 (six) hours.     No Known Allergies  Social History   Socioeconomic History  . Marital status: Married    Spouse name: Not on file  . Number of children: Not on file  . Years of education: Not on file  . Highest education level: Not on file  Occupational History  . Not on file  Tobacco Use  . Smoking status:  Never Smoker  . Smokeless tobacco: Never Used  Substance and Sexual Activity  . Alcohol use: Yes  . Drug use: Never  . Sexual activity: Not on file  Other Topics Concern  . Not on file  Social History Narrative   ** Merged History Encounter **       Social Determinants of Health   Financial Resource Strain:   . Difficulty of Paying Living Expenses: Not on file  Food Insecurity:   . Worried About Charity fundraiser in the Last Year: Not on file  . Ran Out of Food in the Last Year: Not on file  Transportation Needs:   . Lack of Transportation (Medical): Not on file  . Lack of Transportation (Non-Medical): Not on file  Physical Activity:   . Days of Exercise per Week: Not on file  . Minutes of Exercise per Session: Not on file  Stress:   . Feeling of Stress : Not on file  Social Connections:   . Frequency of Communication with Friends and Family: Not on file  . Frequency of Social Gatherings with Friends and Family: Not on file  . Attends Religious Services: Not on file  . Active Member of Clubs or Organizations: Not on file  . Attends Archivist Meetings: Not on file  . Marital Status: Not on file  Intimate Partner Violence:   .  Fear of Current or Ex-Partner: Not on file  . Emotionally Abused: Not on file  . Physically Abused: Not on file  . Sexually Abused: Not on file     Review of Systems: General: negative for chills, fever, night sweats or weight changes.  Cardiovascular: negative for chest pain, dyspnea on exertion, edema, orthopnea, palpitations, paroxysmal nocturnal dyspnea or shortness of breath Dermatological: negative for rash Respiratory: negative for cough or wheezing Urologic: negative for hematuria Abdominal: negative for nausea, vomiting, diarrhea, bright red blood per rectum, melena, or hematemesis Neurologic: negative for visual changes, syncope, or dizziness All other systems reviewed and are otherwise negative except as noted  above.    Blood pressure 133/85, pulse 63, temperature (!) 97 F (36.1 C), height 5\' 8"  (1.727 m), weight (!) 325 lb (147.4 kg), SpO2 99 %.  General appearance: alert and no distress Neck: no adenopathy, no carotid bruit, no JVD, supple, symmetrical, trachea midline and thyroid not enlarged, symmetric, no tenderness/mass/nodules Lungs: clear to auscultation bilaterally Heart: regular rate and rhythm, S1, S2 normal, no murmur, click, rub or gallop Extremities: extremities normal, atraumatic, no cyanosis or edema Pulses: 2+ and symmetric Skin: Skin color, texture, turgor normal. No rashes or lesions Neurologic: Alert and oriented X 3, normal strength and tone. Normal symmetric reflexes. Normal coordination and gait  EKG not performed today  ASSESSMENT AND PLAN:   Abnormal EKG Teresa Zuniga has history of long QTc although recent EKG performed 08/31/2019 showed a QTC of 420 ms.  She is on no QTC prolonging drugs and has no symptoms of this.  Atypical chest pain Recently seen in ER 08/30/2018 with atypical chest pain.  Symptoms have been going on for 3 days.  Her work-up was unremarkable.  Chest CT did not show pulmonary embolism nor did show coronary calcification.  I am to get a coronary calcium score to further evaluate  Dyspnea on exertion Several month history of dyspnea exertion.  History of valvular abnormalities in her family.  I am going to get a 2D echocardiogram to further evaluate       10/29/2018 MD [facility], [facility] 09/16/2019 9:09 AM

## 2019-09-16 NOTE — Assessment & Plan Note (Signed)
Several month history of dyspnea exertion.  History of valvular abnormalities in her family.  I am going to get a 2D echocardiogram to further evaluate

## 2019-09-16 NOTE — Patient Instructions (Signed)
Medication Instructions:  Your physician recommends that you continue on your current medications as directed. Please refer to the Current Medication list given to you today.  If you need a refill on your cardiac medications before your next appointment, please call your pharmacy.   Lab work: NONE  Testing/Procedures: Your physician has requested that you have an echocardiogram. Echocardiography is a painless test that uses sound waves to create images of your heart. It provides your doctor with information about the size and shape of your heart and how well your heart's chambers and valves are working. This procedure takes approximately one hour. There are no restrictions for this procedure. [address]  AND  Coronary Calcium Score  Follow-Up: At [facility], you and your health needs are our priority.  As part of our continuing mission to provide you with exceptional heart care, we have created designated Provider Care Teams.  These Care Teams include your primary Cardiologist (physician) and Advanced Practice Providers (APPs -  Physician Assistants and Nurse Practitioners) who all work together to provide you with the care you need, when you need it. You may see Dr. Allyson Sabal or one of the following Advanced Practice Providers on your designated Care Team:    Corine Shelter, PA-C  [place], [place]  Edd Fabian, [place]  Your physician wants you to follow-up as needed.  Any Other Special Instructions Will Be Listed Below (If Applicable).   Coronary Calcium Scan A coronary calcium scan is an imaging test used to look for deposits of plaque in the inner lining of the blood vessels of the heart (coronary arteries). Plaque is made up of calcium, protein, and fatty substances. These deposits of plaque can partly clog and narrow the coronary arteries without producing any symptoms or warning signs. This puts a person at risk for a heart attack. This test is recommended  for people who are at moderate risk for heart disease. The test can find plaque deposits before symptoms develop. Tell a health care provider about:  Any allergies you have.  All medicines you are taking, including vitamins, herbs, eye drops, creams, and over-the-counter medicines.  Any problems you or family members have had with anesthetic medicines.  Any blood disorders you have.  Any surgeries you have had.  Any medical conditions you have.  Whether you are pregnant or may be pregnant. What are the risks? Generally, this is a safe procedure. However, problems may occur, including:  Harm to a pregnant woman and her unborn baby. This test involves the use of radiation. Radiation exposure can be dangerous to a pregnant woman and her unborn baby. If you are pregnant or think you may be pregnant, you should not have this procedure done.  Slight increase in the risk of cancer. This is because of the radiation involved in the test. What happens before the procedure? Ask your health care provider for any specific instructions on how to prepare for this procedure. You may be asked to avoid products that contain caffeine, tobacco, or nicotine for 4 hours before the procedure. What happens during the procedure?   You will undress and remove any jewelry from your neck or chest.  You will put on a hospital gown.  Sticky electrodes will be placed on your chest. The electrodes will be connected to an electrocardiogram (ECG) machine to record a tracing of the electrical activity of your heart.  You will lie down on a curved bed that is attached to the CT  scanner.  You may be given medicine to slow down your heart rate so that clear pictures can be created.  You will be moved into the CT scanner, and the CT scanner will take pictures of your heart. During this time, you will be asked to lie still and hold your breath for 2-3 seconds at a time while each picture of your heart is being taken.  The procedure may vary among health care providers and hospitals. What happens after the procedure?  You can get dressed.  You can return to your normal activities.  It is up to you to get the results of your procedure. Ask your health care provider, or the department that is doing the procedure, when your results will be ready. Summary  A coronary calcium scan is an imaging test used to look for deposits of plaque in the inner lining of the blood vessels of the heart (coronary arteries). Plaque is made up of calcium, protein, and fatty substances.  Generally, this is a safe procedure. Tell your health care provider if you are pregnant or may be pregnant.  Ask your health care provider for any specific instructions on how to prepare for this procedure.  A CT scanner will take pictures of your heart.  You can return to your normal activities after the scan is done. This information is not intended to replace advice given to you by your health care provider. Make sure you discuss any questions you have with your health care provider. Document Revised: 02/25/2019 Document Reviewed: 02/25/2019 Elsevier Patient Education  [employer].

## 2019-09-16 NOTE — Assessment & Plan Note (Signed)
Recently seen in ER 08/30/2018 with atypical chest pain.  Symptoms have been going on for 3 days.  Her work-up was unremarkable.  Chest CT did not show pulmonary embolism nor did show coronary calcification.  I am to get a coronary calcium score to further evaluate

## 2019-09-16 NOTE — Assessment & Plan Note (Signed)
Teresa Zuniga has history of long QTc although recent EKG performed 08/31/2019 showed a QTC of 420 ms.  She is on no QTC prolonging drugs and has no symptoms of this.

## 2019-09-25 ENCOUNTER — Encounter: Payer: Self-pay | Admitting: Cardiovascular Disease

## 2019-09-25 ENCOUNTER — Ambulatory Visit (INDEPENDENT_AMBULATORY_CARE_PROVIDER_SITE_OTHER)
Admission: RE | Admit: 2019-09-25 | Discharge: 2019-09-25 | Disposition: A | Discharge status: Home / Self Care | Payer: Self-pay | Source: Ambulatory Visit | Attending: Cardiovascular Disease | Admitting: Cardiovascular Disease

## 2019-09-25 ENCOUNTER — Other Ambulatory Visit: Payer: Self-pay

## 2019-09-25 ENCOUNTER — Other Ambulatory Visit (HOSPITAL_COMMUNITY): Payer: 59

## 2019-09-25 ENCOUNTER — Ambulatory Visit (HOSPITAL_COMMUNITY): Payer: 59 | Attending: Cardiology

## 2019-09-25 DIAGNOSIS — R072 Precordial pain: Secondary | ICD-10-CM

## 2019-09-25 DIAGNOSIS — R0789 Other chest pain: Secondary | ICD-10-CM | POA: Insufficient documentation

## 2019-09-25 DIAGNOSIS — R0600 Dyspnea, unspecified: Secondary | ICD-10-CM

## 2019-09-25 DIAGNOSIS — R0609 Other forms of dyspnea: Secondary | ICD-10-CM

## 2019-09-25 NOTE — Telephone Encounter (Signed)
error 

## 2019-12-02 ENCOUNTER — Other Ambulatory Visit: Payer: Self-pay | Admitting: Family Medicine

## 2019-12-02 DIAGNOSIS — R911 Solitary pulmonary nodule: Secondary | ICD-10-CM

## 2019-12-24 ENCOUNTER — Other Ambulatory Visit: Payer: 59

## 2021-01-27 IMAGING — CT CT ANGIO CHEST
2 of 6 series · 18 of 46 positions shown · IV contrast (APPLIED)
Comparison: None.

CLINICAL DATA: Shortness of breath.  Tachycardia.  Chest pain.

EXAM:
CT ANGIOGRAPHY CHEST WITH CONTRAST
TECHNIQUE: Multidetector CT imaging of the chest was performed using the
standard protocol during bolus administration of intravenous
contrast. Multiplanar CT image reconstructions and MIPs were
obtained to evaluate the vascular anatomy.
CONTRAST:  75mL OMNIPAQUE IOHEXOL 350 MG/ML SOLN

[Series 6: thins · axial · 0.75mm/px · z∈[+1223,+1497]mm · 15 of 302 slices shown]
[im 14/302  lung]
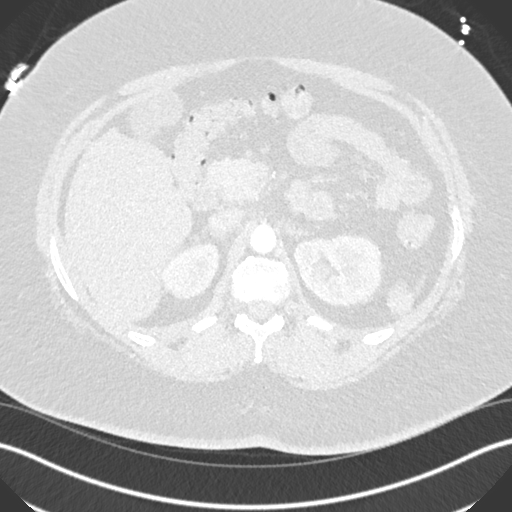
[im 40/302  soft-tissue]
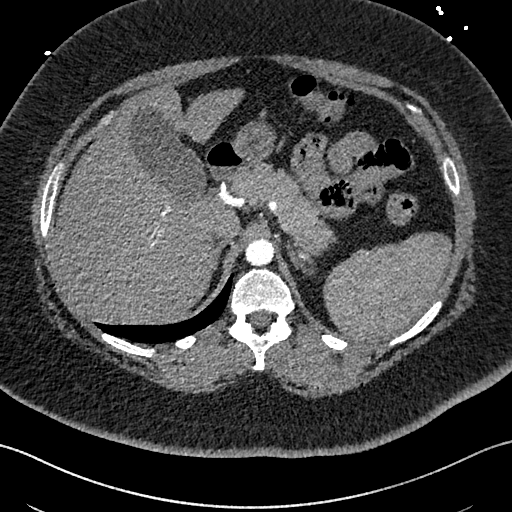
[im 53/302  lung]
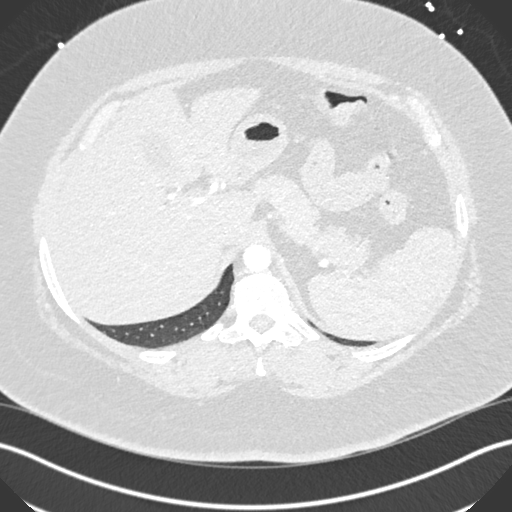
[im 79/302  soft-tissue]
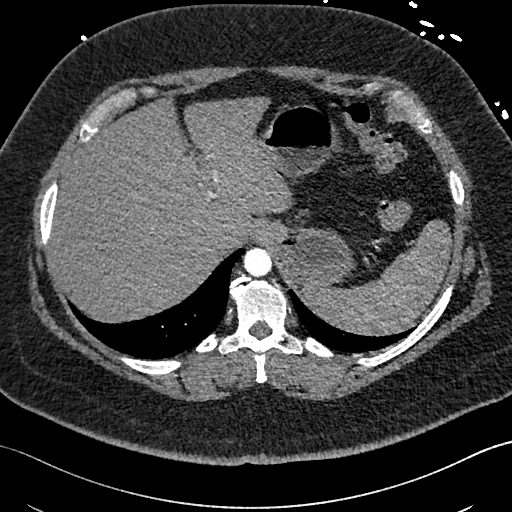
[im 92/302  lung]
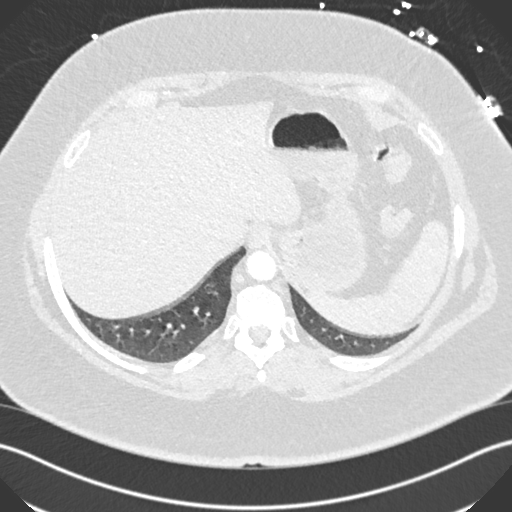
[im 118/302  soft-tissue]
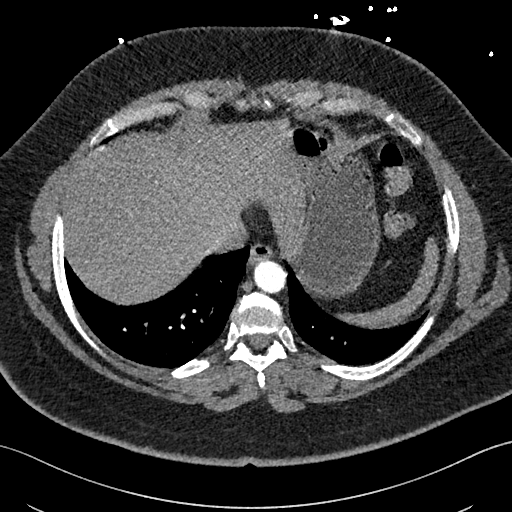
[im 131/302  lung]
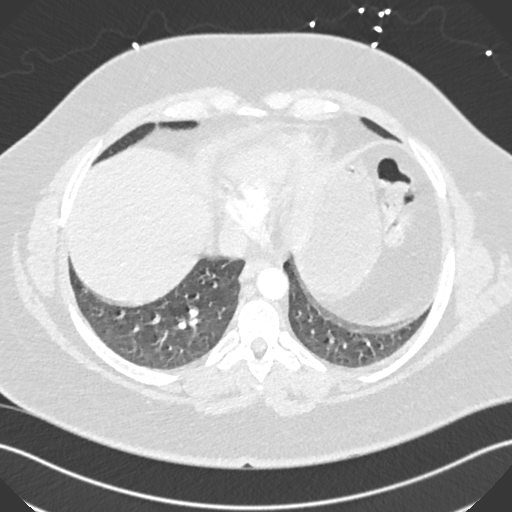
[im 158/302  soft-tissue]
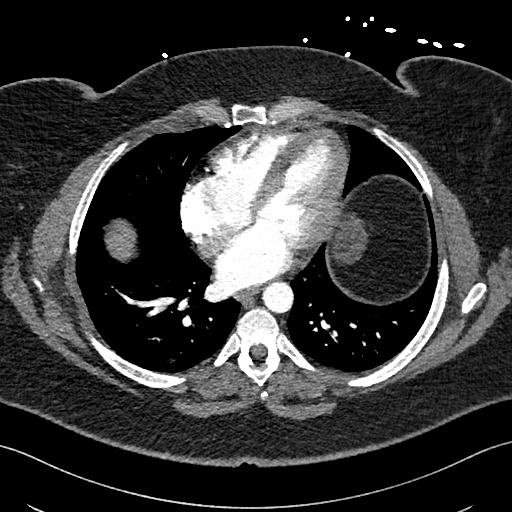
[im 171/302  lung]
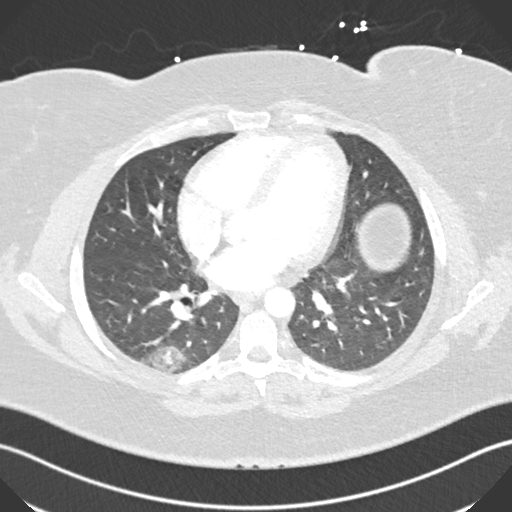
[im 184/302  soft-tissue]
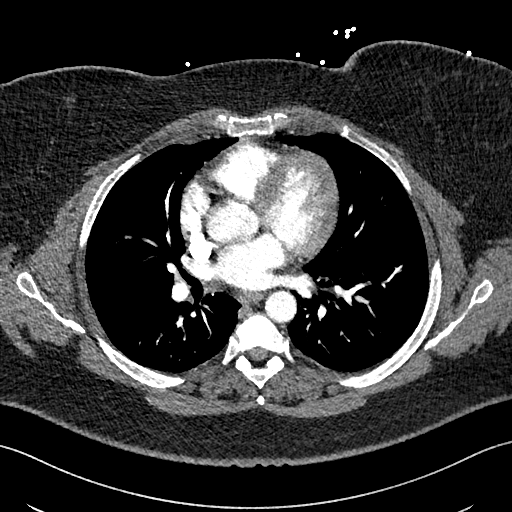
[im 210/302  lung]
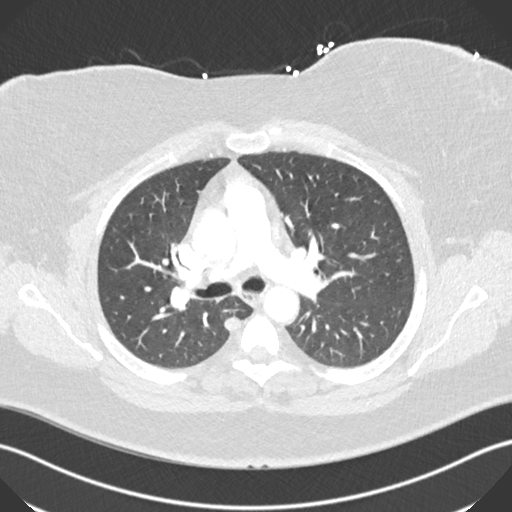
[im 223/302  soft-tissue]
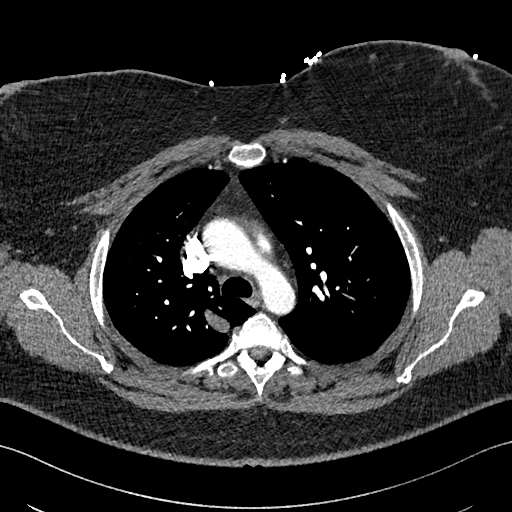
[im 249/302  lung]
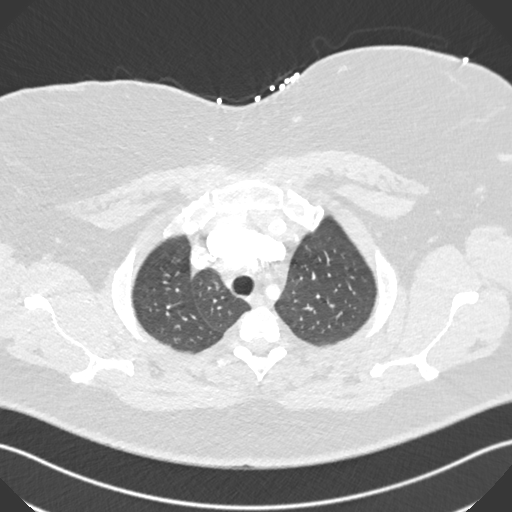
[im 262/302  soft-tissue]
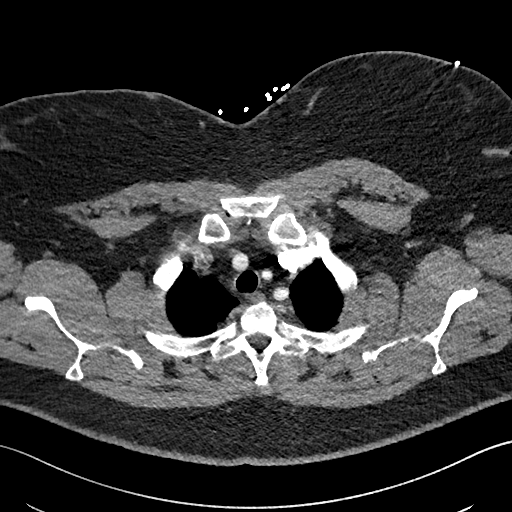
[im 288/302  lung]
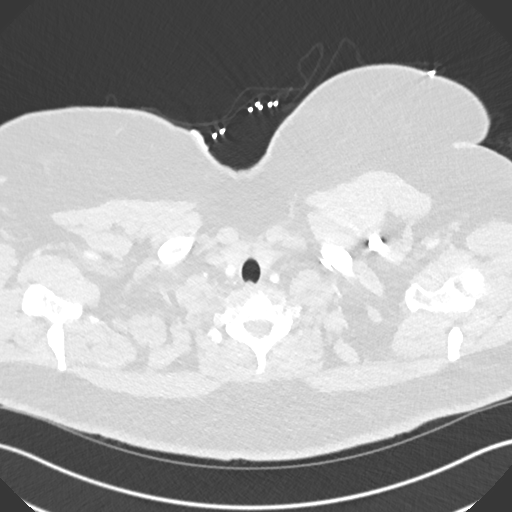

[Series 8: coronal mpr · coronal · 0.61mm/px · 3 of 156 slices shown]
[im 39/156  soft-tissue]
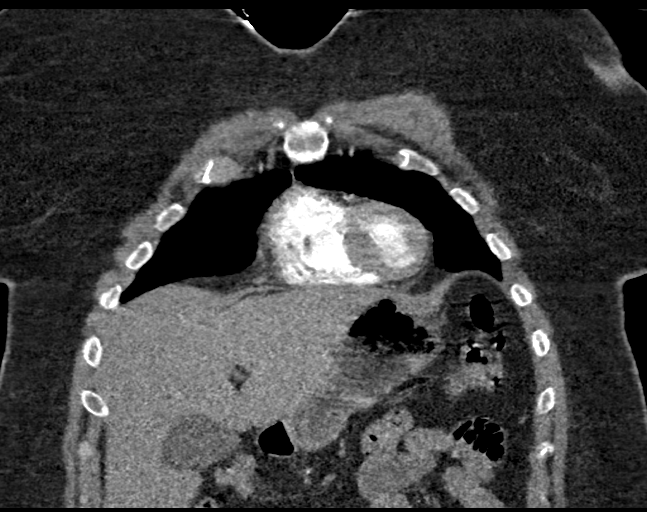
[im 78/156  soft-tissue]
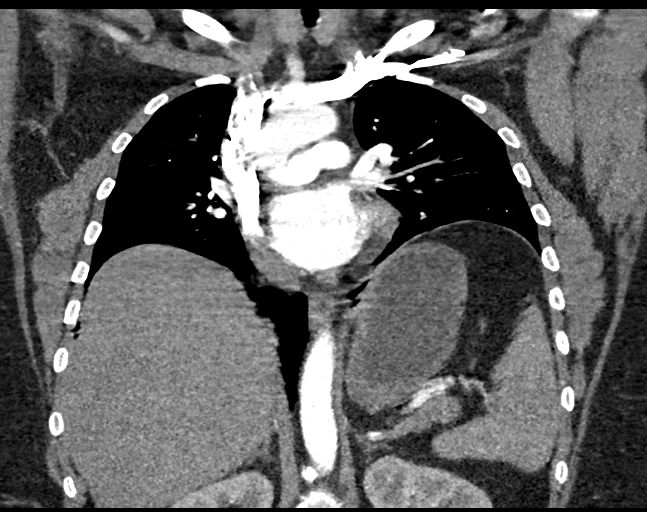
[im 117/156  soft-tissue]
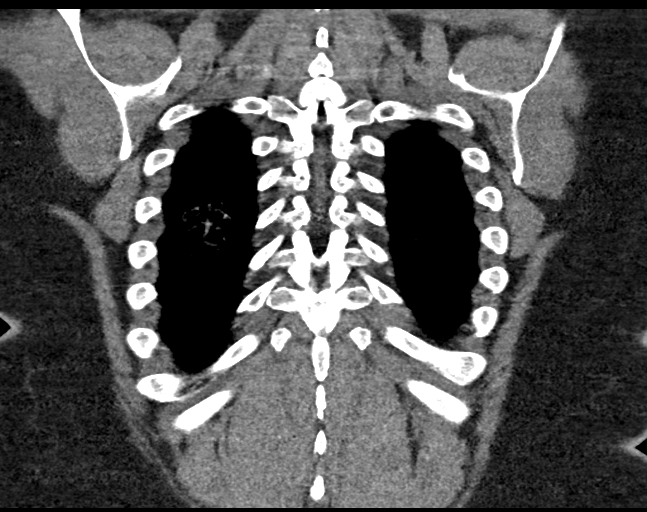

[18 of 46 positions shown; findings below may reference images not displayed]

FINDINGS: Cardiovascular: Satisfactory opacification of the pulmonary arteries
to the segmental level. No evidence of pulmonary embolism. Normal
heart size. No pericardial effusion.

Mediastinum/Nodes: No enlarged mediastinal, hilar, or axillary lymph
nodes. Thyroid gland, trachea, and esophagus demonstrate no
significant findings.

Lungs/Pleura: Central airways are normal. No pneumothorax. Rounded
ground-glass is seen in the posterior right lower lobe on series 7,
image 44 and coronal image 117 measuring up to 3.2 cm on coronal
imaging. No other infiltrates, nodules, or masses.

Upper Abdomen: No acute abnormality.

Musculoskeletal: No chest wall abnormality. No acute or significant
osseous findings.

Review of the MIP images confirms the above findings.
IMPRESSION: 1. There is a rounded region of ground-glass in the posterior right
lower lobe measuring up to 3.2 cm. This finding is nonspecific but
may be infectious or inflammatory given symptoms. Recommend a
follow-up CT scan in 3 months to ensure resolution.
2. No pulmonary emboli.
3. No other acute abnormalities.

## 2021-11-03 ENCOUNTER — Ambulatory Visit (HOSPITAL_BASED_OUTPATIENT_CLINIC_OR_DEPARTMENT_OTHER): Admission: RE | Admit: 2021-11-03 | Payer: 59 | Source: Home / Self Care | Admitting: Orthopedic Surgery

## 2021-11-03 ENCOUNTER — Encounter (HOSPITAL_BASED_OUTPATIENT_CLINIC_OR_DEPARTMENT_OTHER): Admission: RE | Payer: Self-pay | Source: Home / Self Care

## 2021-11-03 SURGERY — CARPOMETACARPAL (CMC) FUSION OF THUMB
Anesthesia: Regional | Site: Thumb | Laterality: Right

## 2022-11-02 ENCOUNTER — Other Ambulatory Visit: Payer: Self-pay | Admitting: Physician Assistant

## 2022-11-02 DIAGNOSIS — H9311 Tinnitus, right ear: Secondary | ICD-10-CM

## 2022-11-02 DIAGNOSIS — H9120 Sudden idiopathic hearing loss, unspecified ear: Secondary | ICD-10-CM

## 2023-03-16 ENCOUNTER — Other Ambulatory Visit: Payer: Self-pay | Admitting: Radiology

## 2023-03-16 DIAGNOSIS — R002 Palpitations: Secondary | ICD-10-CM

## 2023-03-16 DIAGNOSIS — R000 Tachycardia, unspecified: Secondary | ICD-10-CM

## 2023-04-04 DIAGNOSIS — R002 Palpitations: Secondary | ICD-10-CM | POA: Diagnosis not present

## 2023-04-04 DIAGNOSIS — R000 Tachycardia, unspecified: Secondary | ICD-10-CM | POA: Diagnosis not present

## 2023-04-26 ENCOUNTER — Ambulatory Visit: Payer: 59 | Attending: Family Medicine

## 2023-04-26 DIAGNOSIS — R002 Palpitations: Secondary | ICD-10-CM

## 2023-04-26 DIAGNOSIS — R000 Tachycardia, unspecified: Secondary | ICD-10-CM

## 2023-05-11 ENCOUNTER — Encounter: Payer: Self-pay | Admitting: Cardiology

## 2023-05-11 ENCOUNTER — Telehealth: Payer: Self-pay | Admitting: Pharmacist

## 2023-05-11 ENCOUNTER — Other Ambulatory Visit (HOSPITAL_COMMUNITY): Payer: Self-pay

## 2023-05-11 ENCOUNTER — Encounter: Payer: Self-pay | Admitting: Pharmacist

## 2023-05-11 ENCOUNTER — Ambulatory Visit: Payer: 59 | Attending: Cardiology | Admitting: Cardiology

## 2023-05-11 VITALS — BP 122/84 | HR 77 | Ht 69.0 in | Wt 340.0 lb

## 2023-05-11 DIAGNOSIS — R072 Precordial pain: Secondary | ICD-10-CM

## 2023-05-11 DIAGNOSIS — Z01812 Encounter for preprocedural laboratory examination: Secondary | ICD-10-CM | POA: Diagnosis not present

## 2023-05-11 DIAGNOSIS — R002 Palpitations: Secondary | ICD-10-CM

## 2023-05-11 DIAGNOSIS — R0609 Other forms of dyspnea: Secondary | ICD-10-CM

## 2023-05-11 MED ORDER — METOPROLOL TARTRATE 100 MG PO TABS: 100.0000 mg | ORAL_TABLET | ORAL | 0 refills | Status: AC

## 2023-05-11 MED ORDER — METOPROLOL TARTRATE 100 MG PO TABS: 100.0000 mg | ORAL_TABLET | ORAL | 0 refills | Status: DC

## 2023-05-11 MED ORDER — ZEPBOUND 2.5 MG/0.5ML ~~LOC~~ SOAJ: 2.5000 mg | SUBCUTANEOUS | 0 refills | Status: AC

## 2023-05-11 NOTE — Progress Notes (Signed)
Cardiology Office Note:  .   Date:  05/11/2023  ID:  Teresa Zuniga, DOB 08/13/1976, MRN [id number] PCP: Noberto Retort, MD  [facility] Providers Cardiologist:  None    History of Present Illness: .   Teresa Zuniga is a 47 y.o. female here for evaluation of chest pain.   Discussed with the use of AI scribe software  History of Present Illness   The patient, with a history of anxiety, hypothyroidism, and a bulging disc, presents with concerns about her heart health and weight gain. She has a family history of heart disease, with her father having his first heart attack at 28 and passing away from a heart attack at 56, and her mother recently diagnosed with AFib at 34. She has been experiencing heart flutters and irritability, which she attributes to entering menopause. She has tried Wellbutrin for these symptoms, which has helped somewhat but also caused fatigue.  She has also been taking Triametrine for swelling in her hands and feet, which causes her to gain 10 pounds in a few days if not taken. However, she has noticed that the swelling is getting worse. She has also been experiencing increased anxiety and heart flutters, which she is concerned may be AFib. She has had a heart monitor test, which showed an extra beat but no AFib.  The patient has also been struggling with weight gain, despite attempts to be more active. She has a history of a bulging disc, which makes physical activity difficult. She has tried various methods to lose weight, but has not been successful. She has also been experiencing sensations in her feet, such as burning, which she is concerned may be a sign of prediabetes.  She has also been using a CPAP machine for sleep apnea, despite a sleep test indicating she did not have apnea but had hypopnea. She has noticed that her heart rate in the middle of the night would go up to 155-160 and her respiratory rate was also high. She has also experienced sharp chest  pain on occasion, but is unsure if this is due to anxiety.         Studies Reviewed: [name]   EKG Interpretation Date/Time:  Friday May 11 2023 10:04:33 EDT Ventricular Rate:  73 PR Interval:  148 QRS Duration:  84 QT Interval:  402 QTC Calculation: 442 R Axis:   40  Text Interpretation: Normal sinus rhythm No previous ECGs available Confirmed by Donato Schultz ([id number]) on 05/11/2023 10:26:07 AM     Risk Assessment/Calculations:            Physical Exam:   VS:  BP 122/84   Pulse 77   Ht 5\' 9"  (1.753 m)   Wt (!) 340 lb (154.2 kg)   SpO2 97%   BMI 50.21 kg/m    Wt Readings from Last 3 Encounters:  05/11/23 (!) 340 lb (154.2 kg)  09/16/19 (!) 325 lb (147.4 kg)  08/01/16 (!) 307 lb 4.8 oz (139.4 kg)    GEN: Well nourished, well developed in no acute distress NECK: No JVD; No carotid bruits CARDIAC: RRR, no murmurs, rubs, gallops RESPIRATORY:  Clear to auscultation without rales, wheezing or rhonchi  ABDOMEN: Soft, non-tender, non-distended EXTREMITIES:  No edema; No deformity   ASSESSMENT AND PLAN: .    Assessment and Plan    Cardiac Concerns/ Chest pain Reports of palpitations and occasional sharp chest pain. Family history of heart disease and atrial fibrillation. No evidence of atrial fibrillation on 6-day  heart monitor. -Order coronary CT scan to assess for any blockages or soft plaque. -Order echocardiogram to assess heart structure, function, and valves.  Fluid Retention Reports of significant weight gain due to fluid retention, managed with Triametrine. -Continue Triametrine for fluid retention.  Anxiety Reports of increased anxiety, currently managed with Wellbutrin. -Continue Wellbutrin for anxiety management.  Weight Management Struggling with weight loss, considering weight loss medication. -Consult with pharmacy team regarding potential for weight loss medication approval.  Hypothyroidism Chronic condition, not discussed in detail during this  visit.  Follow-up Plan to review results of ordered tests and discuss next steps.              Signed, Donato Schultz, MD

## 2023-05-11 NOTE — Patient Instructions (Signed)
Medication Instructions:  The current medical regimen is effective;  continue present plan and medications.  *If you need a refill on your cardiac medications before your next appointment, please call your pharmacy*   Lab Work: Please have blood work today (BMP) If you have labs (blood work) drawn today and your tests are completely normal, you will receive your results only by: MyChart Message (if you have MyChart) OR A paper copy in the mail If you have any lab test that is abnormal or we need to change your treatment, we will call you to review the results.   Testing/Procedures: Your physician has requested that you have an echocardiogram. Echocardiography is a painless test that uses sound waves to create images of your heart. It provides your doctor with information about the size and shape of your heart and how well your heart's chambers and valves are working. This procedure takes approximately one hour. There are no restrictions for this procedure. Please do NOT wear cologne, perfume, aftershave, or lotions (deodorant is allowed). Please arrive 15 minutes prior to your appointment time.    Your cardiac CT will be scheduled at:   [facility] [address] [place], [place] [address] [phone]  Please arrive at the [facility] Entrance (Entrance C2) of [facility] 30 minutes prior to test start time. You can use the FREE valet parking offered at entrance C (encouraged to control the heart rate for the test)  Proceed to the [facility] Radiology Department (first floor) to check-in and test prep.  All radiology patients and guests should use entrance C2 at [facility], accessed from [facility], even though the hospital's physical address listed is [address]     Please follow these instructions carefully (unless otherwise directed):  An IV will be required for this test and Nitroglycerin will be given.   Hold all erectile dysfunction medications at least 3 days (72 hrs) prior to test. (Ie viagra, cialis, sildenafil, tadalafil, etc)   On the Night Before the Test: Be sure to Drink plenty of water. Do not consume any caffeinated/decaffeinated beverages or chocolate 12 hours prior to your test. Do not take any antihistamines 12 hours prior to your test.  On the Day of the Test: Drink plenty of water until 1 hour prior to the test. Do not eat any food 1 hour prior to test. You may take your regular medications prior to the test.  Take metoprolol (Lopressor) two hours prior to test. If you take Furosemide/Hydrochlorothiazide/Spironolactone, please HOLD on the morning of the test. FEMALES- please wear underwire-free bra if available, avoid dresses & tight clothing      After the Test: Drink plenty of water. After receiving IV contrast, you may experience a mild flushed feeling. This is normal. On occasion, you may experience a mild rash up to 24 hours after the test. This is not dangerous. If this occurs, you can take Benadryl 25 mg and increase your fluid intake. If you experience trouble breathing, this can be serious. If it is severe call 911 IMMEDIATELY. If it is mild, please call our office. If you take any of these medications: Glipizide/Metformin, Avandament, Glucavance, please do not take 48 hours after completing test unless otherwise instructed.  We will call to schedule your test 2-4 weeks out understanding that some insurance companies will need an authorization prior to the service being performed.   For more information and frequently asked questions, please visit our  website : [email or web address]  For non-scheduling related questions, please contact the cardiac imaging nurse navigator should you have any questions/concerns: Cardiac Imaging Nurse Navigators Direct Office Dial: [phone]   For scheduling needs, including cancellations and rescheduling, please  call [place], [phone].   Follow-Up: At [facility], you and your health needs are our priority.  As part of our continuing mission to provide you with exceptional heart care, we have created designated Provider Care Teams.  These Care Teams include your primary Cardiologist (physician) and Advanced Practice Providers (APPs -  Physician Assistants and Nurse Practitioners) who all work together to provide you with the care you need, when you need it.  We recommend signing up for the patient portal called "MyChart".  Sign up information is provided on this After Visit Summary.  MyChart is used to connect with patients for Virtual Visits (Telemedicine).  Patients are able to view lab/test results, encounter notes, upcoming appointments, etc.  Non-urgent messages can be sent to your provider as well.   To learn more about what you can do with MyChart, go to [email or web address].    Your next appointment:   Follow up will be based on the results of the above testing.

## 2023-05-11 NOTE — Telephone Encounter (Signed)
Prior auth approved through 11/08/23. Rx sent to pharmacy to determine if copay is affordable. Pt aware to let me know, would schedule appt with PharmD for med education if so. I am also sending her copay card link via mychart. She was appreciative for the call.

## 2023-05-11 NOTE — Telephone Encounter (Signed)
Pt inquired about trying GLP at appt today with Dr Anne Fu. No history of CAD or DM, has commercial insurance so will submit for Zepbound for weight loss indication. Weight 340 lbs, BMI 50. Will call pt once insurance coverage is known. Key [id number]

## 2023-05-12 LAB — BASIC METABOLIC PANEL
BUN/Creatinine Ratio: 15 (ref 9–23)
BUN: 12 mg/dL (ref 6–24)
CO2: 26 mmol/L (ref 20–29)
Calcium: 9.8 mg/dL (ref 8.7–10.2)
Chloride: 102 mmol/L (ref 96–106)
Creatinine, Ser: 0.82 mg/dL (ref 0.57–1.00)
Glucose: 94 mg/dL (ref 70–99)
Potassium: 4.3 mmol/L (ref 3.5–5.2)
Sodium: 142 mmol/L (ref 134–144)
eGFR: 88 mL/min/{1.73_m2} (ref >59)

## 2023-05-15 NOTE — Telephone Encounter (Signed)
Copay ~$580 even with copay card unfortunately.

## 2023-05-22 ENCOUNTER — Encounter (HOSPITAL_COMMUNITY): Payer: Self-pay

## 2023-05-24 ENCOUNTER — Ambulatory Visit (HOSPITAL_COMMUNITY): Payer: 59

## 2023-05-31 ENCOUNTER — Ambulatory Visit (HOSPITAL_COMMUNITY): Payer: 59

## 2023-07-04 ENCOUNTER — Telehealth (HOSPITAL_COMMUNITY): Payer: Self-pay | Admitting: *Deleted

## 2023-07-04 NOTE — Telephone Encounter (Signed)
Reaching out to patient to offer assistance regarding upcoming cardiac imaging study; patient request to reschedule.   Appt rescheduled.  Johney Frame RN Navigator Cardiac Imaging [facility] [phone] office [phone] cell

## 2023-07-05 ENCOUNTER — Ambulatory Visit (HOSPITAL_COMMUNITY): Payer: 59

## 2023-09-03 ENCOUNTER — Ambulatory Visit (HOSPITAL_COMMUNITY): Payer: 59

## 2023-09-26 ENCOUNTER — Emergency Department (HOSPITAL_BASED_OUTPATIENT_CLINIC_OR_DEPARTMENT_OTHER)
Admission: EM | Admit: 2023-09-26 | Discharge: 2023-09-26 | Disposition: A | Discharge status: Home / Self Care | Payer: 59 | Attending: Emergency Medicine | Admitting: Emergency Medicine

## 2023-09-26 ENCOUNTER — Other Ambulatory Visit: Payer: Self-pay

## 2023-09-26 ENCOUNTER — Encounter (HOSPITAL_BASED_OUTPATIENT_CLINIC_OR_DEPARTMENT_OTHER): Payer: Self-pay

## 2023-09-26 ENCOUNTER — Emergency Department (HOSPITAL_BASED_OUTPATIENT_CLINIC_OR_DEPARTMENT_OTHER): Payer: 59

## 2023-09-26 DIAGNOSIS — R109 Unspecified abdominal pain: Secondary | ICD-10-CM | POA: Insufficient documentation

## 2023-09-26 DIAGNOSIS — R110 Nausea: Secondary | ICD-10-CM | POA: Diagnosis not present

## 2023-09-26 DIAGNOSIS — M62838 Other muscle spasm: Secondary | ICD-10-CM | POA: Insufficient documentation

## 2023-09-26 LAB — PREGNANCY, URINE: Preg Test, Ur: NEGATIVE

## 2023-09-26 LAB — BASIC METABOLIC PANEL
Anion gap: 12 (ref 5–15)
BUN: 12 mg/dL (ref 6–20)
CO2: 25 mmol/L (ref 22–32)
Calcium: 9.5 mg/dL (ref 8.9–10.3)
Chloride: 100 mmol/L (ref 98–111)
Creatinine, Ser: 1.22 mg/dL — ABNORMAL HIGH (ref 0.44–1.00)
GFR, Estimated: 54 mL/min — ABNORMAL LOW (ref >60)
Glucose, Bld: 91 mg/dL (ref 70–99)
Potassium: 3.6 mmol/L (ref 3.5–5.1)
Sodium: 137 mmol/L (ref 135–145)

## 2023-09-26 LAB — URINALYSIS, ROUTINE W REFLEX MICROSCOPIC
Bilirubin Urine: NEGATIVE
Glucose, UA: NEGATIVE mg/dL
Hgb urine dipstick: NEGATIVE
Ketones, ur: NEGATIVE mg/dL
Leukocytes,Ua: NEGATIVE
Nitrite: NEGATIVE
Protein, ur: NEGATIVE mg/dL
Specific Gravity, Urine: 1.007 (ref 1.005–1.030)
pH: 6.5 (ref 5.0–8.0)

## 2023-09-26 LAB — CBC
HCT: 43.4 % (ref 36.0–46.0)
Hemoglobin: 14.6 g/dL (ref 12.0–15.0)
MCH: 28.2 pg (ref 26.0–34.0)
MCHC: 33.6 g/dL (ref 30.0–36.0)
MCV: 83.8 fL (ref 80.0–100.0)
Platelets: 336 10*3/uL (ref 150–400)
RBC: 5.18 MIL/uL — ABNORMAL HIGH (ref 3.87–5.11)
RDW: 13.3 % (ref 11.5–15.5)
WBC: 8.6 10*3/uL (ref 4.0–10.5)
nRBC: 0 % (ref 0.0–0.2)

## 2023-09-26 MED ORDER — MORPHINE SULFATE (PF) 4 MG/ML IV SOLN
4.0000 mg | Freq: Once | INTRAVENOUS | Status: AC
  Administered 2023-09-26: 4 mg via INTRAVENOUS
  Filled 2023-09-26: qty 1

## 2023-09-26 MED ORDER — ONDANSETRON HCL 4 MG/2ML IJ SOLN
4.0000 mg | Freq: Once | INTRAMUSCULAR | Status: AC
  Administered 2023-09-26: 4 mg via INTRAVENOUS
  Filled 2023-09-26: qty 2

## 2023-09-26 MED ORDER — LIDOCAINE 5 % EX PTCH: 1.0000 | MEDICATED_PATCH | CUTANEOUS | 0 refills | Status: AC

## 2023-09-26 MED ORDER — METHOCARBAMOL 500 MG PO TABS: 500.0000 mg | ORAL_TABLET | Freq: Two times a day (BID) | ORAL | 0 refills | Status: AC

## 2023-09-26 MED ORDER — DIAZEPAM 5 MG/ML IJ SOLN
2.5000 mg | Freq: Once | INTRAMUSCULAR | Status: AC
  Administered 2023-09-26: 2.5 mg via INTRAVENOUS
  Filled 2023-09-26: qty 2

## 2023-09-26 MED ORDER — SODIUM CHLORIDE 0.9 % IV BOLUS
1000.0000 mL | Freq: Once | INTRAVENOUS | Status: AC
  Administered 2023-09-26: 1000 mL via INTRAVENOUS

## 2023-09-26 NOTE — ED Provider Notes (Signed)
 [place] EMERGENCY DEPARTMENT AT [facility] Provider Note   CSN: [id number] Arrival date & time: 09/26/23  1145     History  Chief Complaint  Patient presents with   Flank Pain    Teresa Zuniga is a 48 y.o. female.  48 year old female presents today for concern of right-sided flank pain.  No dysuria.  But states when she is urinating she does have some pain in the left flank.  Denies any gross hematuria.  No prior history of kidney stones.  Some nausea but no vomiting.  Denies other complaints.  States pain i is present mostly only with movement.  It is hard for her to bed.  Symptoms started this morning.  The history is provided by the patient. No language interpreter was used.       Home Medications Prior to Admission medications   Medication Sig Start Date End Date Taking? Authorizing Provider  acetaminophen (TYLENOL) 500 MG tablet Take 500 mg by mouth every 6 (six) hours as needed for mild pain or headache.    [provider]  Ashwagandha 500 MG CAPS Take 500 mg by mouth.    [provider]  Cholecalciferol (D-3-5) 125 MCG (5000 UT) capsule Take 5,000 Units by mouth daily.    [provider]  Cyanocobalamin (VITAMIN B 12 PO) Take 5,000 mcg by mouth daily.    [provider]  levothyroxine (SYNTHROID) 125 MCG tablet Take 125 mcg by mouth daily.    [provider]  magnesium gluconate (MAGONATE) 500 MG tablet Take 500 mg by mouth 2 (two) times daily.    [provider]  Magnesium Oxide (CVS LAXATIVE DIETARY SUPPLEMNT PO) Take by mouth.    [provider]  metoprolol  tartrate (LOPRESSOR ) 100 MG tablet Take 1 tablet (100 mg total) by mouth as directed. Take one tablet (2) hours before your CT scan 05/11/23   Jeffrie Oneil BROCKS, MD  triamterene-hydrochlorothiazide (MAXZIDE) 75-50 MG tablet Take 1 tablet by mouth daily.    [provider]  ZEPBOUND  2.5 MG/0.5ML Pen Inject 2.5 mg into the skin once a week.  05/11/23   Jeffrie Oneil BROCKS, MD      Allergies    Patient has no known allergies.    Review of Systems   Review of Systems  Constitutional:  Negative for chills and fever.  Gastrointestinal:  Positive for abdominal pain and nausea. Negative for vomiting.  Genitourinary:  Positive for flank pain. Negative for dysuria.  All other systems reviewed and are negative.   Physical Exam Updated Vital Signs BP 131/75   Pulse 83   Temp 98 F (36.7 C)   Resp 16   Ht 5' 9 (1.753 m)   Wt (!) 154 kg   LMP  (LMP Unknown)   SpO2 96%   BMI 50.14 kg/m  Physical Exam Vitals and nursing note reviewed.  Constitutional:      General: She is not in acute distress.    Appearance: Normal appearance. She is not ill-appearing.  HENT:     Head: Normocephalic and atraumatic.     Nose: Nose normal.  Eyes:     Conjunctiva/sclera: Conjunctivae normal.  Cardiovascular:     Rate and Rhythm: Normal rate and regular rhythm.  Pulmonary:     Effort: Pulmonary effort is normal. No respiratory distress.  Abdominal:     General: There is no distension.     Tenderness: There is no abdominal tenderness. There is no right CVA tenderness, left CVA tenderness  or guarding.  Musculoskeletal:        General: No deformity.  Skin:    Findings: No rash.  Neurological:     Mental Status: She is alert.     ED Results / Procedures / Treatments   Labs (all labs ordered are listed, but only abnormal results are displayed) Labs Reviewed  CBC - Abnormal; Notable for the following components:      Result Value   RBC 5.18 (*)    All other components within normal limits  BASIC METABOLIC PANEL - Abnormal; Notable for the following components:   Creatinine, Ser 1.22 (*)    GFR, Estimated 54 (*)    All other components within normal limits  PREGNANCY, URINE  URINALYSIS, ROUTINE W REFLEX MICROSCOPIC    EKG None  Radiology No results found.  Procedures Procedures    Medications Ordered in ED Medications   sodium chloride  0.9 % bolus 1,000 mL (1,000 mLs Intravenous New Bag/Given 09/26/23 1642)  ondansetron  (ZOFRAN ) injection 4 mg (4 mg Intravenous Given 09/26/23 1644)  morphine  (PF) 4 MG/ML injection 4 mg (4 mg Intravenous Given 09/26/23 1644)    ED Course/ Medical Decision Making/ A&P                                 Medical Decision Making Amount and/or Complexity of Data Reviewed Labs: ordered. Radiology: ordered.  Risk Prescription drug management.   Medical Decision Making / ED Course   This patient presents to the ED for concern of flank pain, this involves an extensive number of treatment options, and is a complaint that carries with it a high risk of complications and morbidity.  The differential diagnosis includes nephrolithiasis, pyelonephritis, UTI, diverticulitis, colitis, gastroenteritis, muscle strain, muscle spasm  MDM: 48 year old female presents today for concern of flank pain.  Will obtain labs, renal stone study, UA.  Admission considered but will reevaluate after labs and imaging.  CBC without leukocytosis or anemia.  BMP with creatinine 1.22 otherwise without acute concern.  UA unremarkable.  CT renal stone study without any acute findings.  Likely MSK in etiology.  Likely muscle spasm.  Will give dose of Valium  to the ED.  Will discharge with Lidoderm  and Robaxin .  Return precaution discussed.  Patient voices understanding and is in agreement with plan.  Discussed follow-up with PCP.  Lab Tests: -I ordered, reviewed, and interpreted labs.   The pertinent results include:   Labs Reviewed  CBC - Abnormal; Notable for the following components:      Result Value   RBC 5.18 (*)    All other components within normal limits  BASIC METABOLIC PANEL - Abnormal; Notable for the following components:   Creatinine, Ser 1.22 (*)    GFR, Estimated 54 (*)    All other components within normal limits  PREGNANCY, URINE  URINALYSIS, ROUTINE W REFLEX MICROSCOPIC      EKG   EKG Interpretation Date/Time:    Ventricular Rate:    PR Interval:    QRS Duration:    QT Interval:    QTC Calculation:   R Axis:      Text Interpretation:           Imaging Studies ordered: I ordered imaging studies including ct renal stone study I independently visualized and interpreted imaging. I agree with the radiologist interpretation   Medicines ordered and prescription drug management: Meds ordered this encounter  Medications  sodium chloride  0.9 % bolus 1,000 mL   ondansetron  (ZOFRAN ) injection 4 mg   morphine  (PF) 4 MG/ML injection 4 mg    -I have reviewed the patients home medicines and have made adjustments as needed   Reevaluation: After the interventions noted above, I reevaluated the patient and found that they have :improved  Co morbidities that complicate the patient evaluation  Past Medical History:  Diagnosis Date   Thyroid  disease       Dispostion: Discharged in stable condition.  Return precaution discussed.  Patient voices understanding and is in agreement with plan.   Final Clinical Impression(s) / ED Diagnoses Final diagnoses:  Left flank pain  Muscle spasm    Rx / DC Orders ED Discharge Orders          Ordered    methocarbamol  (ROBAXIN ) 500 MG tablet  2 times daily        09/26/23 2006    lidocaine  (LIDODERM ) 5 %  Every 24 hours        09/26/23 2006              Hildegard Loge, PA-C 09/26/23 2006    Cottie Donnice PARAS, MD 09/27/23 BEATRIS

## 2023-09-26 NOTE — Discharge Instructions (Addendum)
 Your workup today was reassuring. No concerning findings of your flank pain on the CT.  This is likely musculoskeletal in nature.  Could be a muscle spasm.  I have given you a dose of Valium  in the emergency department.  Sent muscle relaxer and lidocaine  patch into the pharmacy for you.  Follow-up with your primary care provider.  Return for any emergent symptoms.

## 2023-09-26 NOTE — ED Notes (Signed)
 Pt unable to provide urine sample at this time

## 2023-09-26 NOTE — ED Triage Notes (Signed)
 Was seen at urgent care sent here to R/O kidney stone.  UA showed blood in urine.  Pain to left flank. Slight nausea no vomiting Pain worse with movement

## 2023-10-11 ENCOUNTER — Telehealth: Payer: Self-pay | Admitting: Pharmacy Technician

## 2023-10-11 ENCOUNTER — Other Ambulatory Visit (HOSPITAL_COMMUNITY): Payer: Self-pay

## 2023-10-11 NOTE — Telephone Encounter (Signed)
Pharmacy Patient Advocate Encounter  Received notification from [facility] that Prior Authorization for Zepbound 2.5MG /0.5ML pen-injectors has been APPROVED from 10/11/23 to 04/09/24. Spoke to pharmacy to process.Copay is $212.00.    PA #/Case ID/Reference #: [id number]   I called the patient and left a message. She can try to get a coupon at the zepbound lilly website  [email or web address]-[email or web address]-card-tab#tab-container

## 2023-10-11 NOTE — Telephone Encounter (Signed)
Pharmacy Patient Advocate Encounter   Received notification from CoverMyMeds that prior authorization for zepbound is required/requested.   Insurance verification completed.   The patient is insured through [facility] .   Per test claim: PA required; PA submitted to above mentioned insurance via CoverMyMeds Key/confirmation #/EOC [id number] Status is pending

## 2024-02-26 ENCOUNTER — Encounter: Payer: Self-pay | Admitting: Plastic Surgery

## 2024-02-26 ENCOUNTER — Ambulatory Visit: Admitting: Plastic Surgery

## 2024-02-26 DIAGNOSIS — G8929 Other chronic pain: Secondary | ICD-10-CM

## 2024-02-26 DIAGNOSIS — M793 Panniculitis, unspecified: Secondary | ICD-10-CM | POA: Insufficient documentation

## 2024-02-26 DIAGNOSIS — M549 Dorsalgia, unspecified: Secondary | ICD-10-CM | POA: Insufficient documentation

## 2024-02-26 DIAGNOSIS — M546 Pain in thoracic spine: Secondary | ICD-10-CM | POA: Diagnosis not present

## 2024-02-26 NOTE — Progress Notes (Signed)
 Patient ID: Teresa Zuniga, female    DOB: October 31, 1975, 48 y.o.   MRN: [id number]   Chief Complaint  Patient presents with   Advice Only    The patient is a 48 year old female here for evaluation of her abdomen.  She is 5 feet 9 inches tall and weighs 238 pounds.  She has decreased her weight from 343 pounds a year ago.  She has been doing this with medication and has been very diligent about keeping her weight down.  Her past surgical history includes a C-section.  Her last hemoglobin A1c was April and was 5.2.  Her past medical history includes restless leg syndrome, hypothyroidism and migraines.  She is thinking that she may be able to lose a little bit more weight and so she would like to do that prior to the surgery and I think that is probably a really good idea.  No sign of a hernia.  She does have some back and neck pain and some skin breakdown.    Review of Systems  Constitutional:  Positive for activity change. Negative for appetite change.  Eyes: Negative.   Respiratory: Negative.    Cardiovascular: Negative.   Gastrointestinal: Negative.   Endocrine: Negative.   Musculoskeletal:  Positive for back pain and neck pain.  Skin:  Positive for rash.    Past Medical History:  Diagnosis Date   Thyroid  disease     History reviewed. No pertinent surgical history.    Current Outpatient Medications:    Ashwagandha 500 MG CAPS, Take 500 mg by mouth., Disp: , Rfl:    buPROPion (WELLBUTRIN XL) 300 MG 24 hr tablet, Take 300 mg by mouth daily., Disp: , Rfl:    metFORMIN (GLUCOPHAGE) 500 MG tablet, Take by mouth 2 (two) times daily with a meal., Disp: , Rfl:    triamterene-hydrochlorothiazide (MAXZIDE) 75-50 MG tablet, Take 1 tablet by mouth daily., Disp: , Rfl:    ZEPBOUND  2.5 MG/0.5ML Pen, Inject 2.5 mg into the skin once a week., Disp: 2 mL, Rfl: 0   acetaminophen (TYLENOL) 500 MG tablet, Take 500 mg by mouth every 6 (six) hours as needed for mild pain or headache. (Patient not  taking: Reported on 02/26/2024), Disp: , Rfl:    Cholecalciferol (D-3-5) 125 MCG (5000 UT) capsule, Take 5,000 Units by mouth daily. (Patient not taking: Reported on 02/26/2024), Disp: , Rfl:    Cyanocobalamin (VITAMIN B 12 PO), Take 5,000 mcg by mouth daily., Disp: , Rfl:    levothyroxine (SYNTHROID) 125 MCG tablet, Take 125 mcg by mouth daily., Disp: , Rfl:    lidocaine  (LIDODERM ) 5 %, Place 1 patch onto the skin daily. Remove & Discard patch within 12 hours or as directed by MD (Patient not taking: Reported on 02/26/2024), Disp: 14 patch, Rfl: 0   magnesium gluconate (MAGONATE) 500 MG tablet, Take 500 mg by mouth 2 (two) times daily. (Patient not taking: Reported on 02/26/2024), Disp: , Rfl:    Magnesium Oxide (CVS LAXATIVE DIETARY SUPPLEMNT PO), Take by mouth. (Patient not taking: Reported on 02/26/2024), Disp: , Rfl:    methocarbamol  (ROBAXIN ) 500 MG tablet, Take 1 tablet (500 mg total) by mouth 2 (two) times daily. (Patient not taking: Reported on 02/26/2024), Disp: 20 tablet, Rfl: 0   metoprolol  tartrate (LOPRESSOR ) 100 MG tablet, Take 1 tablet (100 mg total) by mouth as directed. Take one tablet (2) hours before your CT scan (Patient not taking: Reported on 02/26/2024), Disp: 1 tablet, Rfl: 0  Objective:   Vitals:   02/26/24 1407  BP: 131/83  Pulse: 92  SpO2: 98%    Physical Exam Vitals reviewed.  HENT:     Head: Atraumatic.  Cardiovascular:     Rate and Rhythm: Normal rate.     Pulses: Normal pulses.  Pulmonary:     Effort: Pulmonary effort is normal.  Abdominal:     Palpations: Abdomen is soft.  Skin:    General: Skin is warm.     Capillary Refill: Capillary refill takes less than 2 seconds.  Neurological:     Mental Status: She is alert and oriented to person, place, and time.  Psychiatric:        Mood and Affect: Mood normal.        Behavior: Behavior normal.        Thought Content: Thought content normal.        Judgment: Judgment normal.     Assessment & Plan:  Morbid  obesity (HCC)  Panniculitis  Chronic bilateral thoracic back pain  I think the patient will be a very good candidate for panniculectomy and possible add-on abdominoplasty.  She is going to try and lose a little bit more weight prior to surgery which I think is a good idea.  We will see her back in 2 months.  Pictures were obtained of the patient and placed in the chart with the patient's or guardian's permission.   Teresa RAMAN Maxime Beckner, DO

## 2024-03-14 ENCOUNTER — Telehealth: Payer: Self-pay | Admitting: Pharmacy Technician

## 2024-03-14 NOTE — Telephone Encounter (Signed)
    Came thru cmm renewal -she has not gotten zepbound  so I did not do a renewal.

## 2024-05-27 ENCOUNTER — Ambulatory Visit: Admitting: Surgical

## 2024-06-02 ENCOUNTER — Ambulatory Visit: Admitting: Student

## 2024-06-03 ENCOUNTER — Ambulatory Visit: Admitting: Student

## 2024-06-04 ENCOUNTER — Encounter: Payer: Self-pay | Admitting: Student

## 2024-06-04 ENCOUNTER — Ambulatory Visit: Admitting: Student

## 2024-06-04 VITALS — BP 104/72 | HR 69 | Ht 69.0 in | Wt 207.0 lb

## 2024-06-04 DIAGNOSIS — L987 Excessive and redundant skin and subcutaneous tissue: Secondary | ICD-10-CM

## 2024-06-04 DIAGNOSIS — Z6830 Body mass index (BMI) 30.0-30.9, adult: Secondary | ICD-10-CM | POA: Diagnosis not present

## 2024-06-04 DIAGNOSIS — M793 Panniculitis, unspecified: Secondary | ICD-10-CM

## 2024-06-04 NOTE — Progress Notes (Signed)
   Referring Provider Arloa Elsie SAUNDERS, MD [phone] [place],  [place] [address]   CC:  Chief Complaint  Patient presents with   Follow-up      Teresa Zuniga is an 48 y.o. female.  HPI: Patient is a 48 year old female who presents to the clinic today for follow-up to further discuss possible panniculectomy.  Patient was seen for initial consult by Dr. Lowery on 02/26/2024.  At this visit, patient weighed 238 pounds and reported that she had decreased her weight from the year prior which was 343 pounds.  Patient contributed her weight loss to medication and had been very diligent about keeping her weight down.  Patient was thinking that she may be able to lose a little bit more weight and wanted to do that prior to surgery.  Patient did report she had some back and neck pain and skin breakdown as well.  Patient was found to be a good candidate for panniculectomy and possible add-on abdominoplasty.  Patient was going to try and lose a little bit more weight prior to surgery and plan was for her to follow back up in 2 months.  Today, patient reports she is doing well.  She states that she has lost about another 30 pounds since her last visit and feels that she is at/close to her goal weight.  She states that she has been doing this through medication, diet changes and exercise.  Patient does report she is interested in moving forward with panniculectomy with upper abdominal add-on.  She also states that she may want the excess skin to her thighs addressed as well.  Review of Systems General: Does not report changes in her health  Physical Exam    06/04/2024   11:03 AM 02/26/2024    2:07 PM 09/26/2023    8:54 PM  Vitals with BMI  Height 5' 9 5' 9   Weight 207 lbs 238 lbs   BMI 30.55 35.13   Systolic 104 131 851  Diastolic 72 83 55  Pulse 69 92 69    General:  No acute distress,  Alert and oriented, Non-Toxic, Normal speech and affect Pulmonary: Unlabored breathing,  no respiratory distress Abdomen: Excess skin noted to the abdomen Thighs: Excess skin noted to the bilateral thighs MSK: Ambulatory Psych: Normal mood, normal behavior  Assessment/Plan  Panniculitis   Patient states that she wants to start the process for scheduling her abdominoplasty.  Will go ahead and submit to her insurance for the panniculectomy.  Discussed with the patient that this process can take up to 6 weeks.  Patient expressed understanding.  Recommended that patient have a telephone visit with Dr. Lowery to discuss her thighs as well as to discuss further surgical questions that she has. Patient expressed understanding was in agreement with this.  Pictures were obtained of the patient and placed in the chart with the patient's or guardian's permission.   Instructed the patient to call if she has any questions or concerns about anything.  Teresa Zuniga 06/04/2024, 12:43 PM

## 2024-06-06 ENCOUNTER — Telehealth: Admitting: Plastic Surgery

## 2024-06-06 DIAGNOSIS — M793 Panniculitis, unspecified: Secondary | ICD-10-CM

## 2024-06-06 DIAGNOSIS — G8929 Other chronic pain: Secondary | ICD-10-CM

## 2024-06-06 NOTE — Progress Notes (Signed)
 Left a message

## 2024-06-10 ENCOUNTER — Telehealth: Payer: Self-pay | Admitting: Plastic Surgery

## 2024-06-10 NOTE — Telephone Encounter (Signed)
 Pt just called and said she was supposed to have a televisit on Friday 06-06-24, pt stated she was not called. Pt wants to know how she need to move forward, she stated she was billed for the visit.

## 2024-06-17 ENCOUNTER — Encounter: Payer: Self-pay | Admitting: Plastic Surgery

## 2024-06-17 ENCOUNTER — Ambulatory Visit: Payer: Self-pay | Admitting: Plastic Surgery

## 2024-06-17 DIAGNOSIS — M546 Pain in thoracic spine: Secondary | ICD-10-CM | POA: Diagnosis not present

## 2024-06-17 DIAGNOSIS — M542 Cervicalgia: Secondary | ICD-10-CM | POA: Diagnosis not present

## 2024-06-17 DIAGNOSIS — M793 Panniculitis, unspecified: Secondary | ICD-10-CM

## 2024-06-17 DIAGNOSIS — G8929 Other chronic pain: Secondary | ICD-10-CM

## 2024-06-17 NOTE — Telephone Encounter (Signed)
 See msg about note today

## 2024-06-17 NOTE — Progress Notes (Signed)
   Subjective:    Patient ID: Teresa Zuniga, female    DOB: June 19, 1976, 48 y.o.   MRN: [id number]  The patient is a 48 year old female joining me by phone.  The patient is at home and I am at the office.  The patient was first seen in July and then again in October.  Her original weight was 343 pounds.  She is now down to 299 pounds.  She is taking medication that has been a huge help and keeping her weight down.  She complains of back and neck pain that has not subsided.  She also gets skin breakdown within her grooves.  She does have a history of a C-section.  Her last hemoglobin A1c in April was 5.2.  Past medical history is positive for restless leg syndrome, hypothyroidism and migraines.  She is pleased with her current weight.  She would like to move ahead with panniculectomy and abdominoplasty.      Review of Systems  Constitutional: Negative.   Eyes: Negative.   Respiratory: Negative.    Cardiovascular: Negative.   Gastrointestinal: Negative.   Musculoskeletal:  Positive for back pain and neck pain.  Skin:  Positive for rash.       Objective:   Physical Exam      Assessment & Plan:     ICD-10-CM   1. Chronic bilateral thoracic back pain  M54.6    G89.29     2. Panniculitis  M79.3     3. Morbid obesity (HCC)  E66.01        I connected with  Teresa Zuniga on 06/17/24 by phone and verified that I am speaking with the correct person using two identifiers.   I discussed the limitations of evaluation and management by telemedicine. The patient expressed understanding and agreed to proceed.  The patient was at home and I was at the office.  We spent 5 minutes in discussion.  The patient would like to move ahead with panniculectomy and add on upper abdominoplasty.
